# Patient Record
Sex: Male | Born: 1995 | Race: White | Hispanic: No | Marital: Single | Smoking: Current every day smoker
Health system: Southern US, Community
[De-identification: ages and names within clinical notes are randomized; demographics above are authoritative.]

## PROBLEM LIST (undated history)

## (undated) DIAGNOSIS — F909 Attention-deficit hyperactivity disorder, unspecified type: Secondary | ICD-10-CM

## (undated) DIAGNOSIS — S62609A Fracture of unspecified phalanx of unspecified finger, initial encounter for closed fracture: Secondary | ICD-10-CM

## (undated) DIAGNOSIS — Z8709 Personal history of other diseases of the respiratory system: Secondary | ICD-10-CM

## (undated) HISTORY — PX: NO PAST SURGERIES: SHX2092

---
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
## 2015-05-25 ENCOUNTER — Emergency Department (HOSPITAL_COMMUNITY)
Admission: EM | Admit: 2015-05-25 | Discharge: 2015-05-25 | Disposition: A | Discharge status: Home / Self Care | Payer: Medicaid Other | Attending: Emergency Medicine | Admitting: Emergency Medicine

## 2015-05-25 ENCOUNTER — Encounter (HOSPITAL_COMMUNITY): Payer: Self-pay | Admitting: *Deleted

## 2015-05-25 DIAGNOSIS — W260XXA Contact with knife, initial encounter: Secondary | ICD-10-CM | POA: Diagnosis not present

## 2015-05-25 DIAGNOSIS — Y9289 Other specified places as the place of occurrence of the external cause: Secondary | ICD-10-CM | POA: Insufficient documentation

## 2015-05-25 DIAGNOSIS — F909 Attention-deficit hyperactivity disorder, unspecified type: Secondary | ICD-10-CM | POA: Insufficient documentation

## 2015-05-25 DIAGNOSIS — Y9389 Activity, other specified: Secondary | ICD-10-CM | POA: Insufficient documentation

## 2015-05-25 DIAGNOSIS — Z23 Encounter for immunization: Secondary | ICD-10-CM | POA: Insufficient documentation

## 2015-05-25 DIAGNOSIS — Z79899 Other long term (current) drug therapy: Secondary | ICD-10-CM | POA: Diagnosis not present

## 2015-05-25 DIAGNOSIS — Y990 Civilian activity done for income or pay: Secondary | ICD-10-CM | POA: Insufficient documentation

## 2015-05-25 DIAGNOSIS — J45909 Unspecified asthma, uncomplicated: Secondary | ICD-10-CM | POA: Diagnosis not present

## 2015-05-25 DIAGNOSIS — S61212A Laceration without foreign body of right middle finger without damage to nail, initial encounter: Secondary | ICD-10-CM | POA: Diagnosis not present

## 2015-05-25 DIAGNOSIS — Z8781 Personal history of (healed) traumatic fracture: Secondary | ICD-10-CM | POA: Insufficient documentation

## 2015-05-25 DIAGNOSIS — IMO0002 Reserved for concepts with insufficient information to code with codable children: Secondary | ICD-10-CM

## 2015-05-25 DIAGNOSIS — S6991XA Unspecified injury of right wrist, hand and finger(s), initial encounter: Secondary | ICD-10-CM | POA: Diagnosis present

## 2015-05-25 MED ORDER — TETANUS-DIPHTH-ACELL PERTUSSIS 5-2.5-18.5 LF-MCG/0.5 IM SUSP
0.5000 mL | Freq: Once | INTRAMUSCULAR | Status: AC
  Administered 2015-05-25: 0.5 mL via INTRAMUSCULAR
  Filled 2015-05-25: qty 0.5

## 2015-05-25 NOTE — ED Notes (Signed)
Patient verbalized understanding of discharge instructions and denies any further needs or questions at this time. VS stable. Patient ambulatory with steady gait.  

## 2015-05-25 NOTE — ED Notes (Signed)
Pt in c/o laceration to his right middle finger from a knife at work, unsure of last tetanus, bleeding controlled

## 2015-05-25 NOTE — ED Notes (Signed)
Pt refusing xray, PA notified

## 2015-05-25 NOTE — ED Provider Notes (Signed)
CSN: [id number]     Arrival date & time 05/25/15  2143 History  By signing my name below, I, Lyndel Safe, attest that this documentation has been prepared under the direction and in the presence of Glean Hess, [address] Electronically Signed: Lyndel Safe, ED Scribe. 05/25/2015. 10:15 PM.   Chief Complaint  Patient presents with  . Laceration    The history is provided by the patient. No language interpreter was used.    HPI Comments: Charles Henry is a 20 y.o. male who presents to the Emergency Department for chief complaint of right middle finger distal, medial laceration sustained while trying to close his knife while at work PTA. Bleeding is controlled with pressure. He associates constant, moderate throbbing pain to right index finger. He denies exacerbating or alleviating factors. He denies numbness, tingling, or weakness. Tetanus UTD.   Past Medical History  Diagnosis Date  . Fracture of finger of left hand 06/2014    left 3rd finger PI fx.  . ADHD (attention deficit hyperactivity disorder)   . History of asthma     no current med.   Past Surgical History  Procedure Laterality Date  . No past surgeries    . Percutaneous pinning Left 06/09/2014    Procedure: PERCUTANEOUS PINNING EXTREMITY, LEFT 3RD FINGER;  Surgeon: Cheral Almas, MD;  Location: [facility];  Service: Orthopedics;  Laterality: Left;   History reviewed. No pertinent family history. Social History  Substance Use Topics  . Smoking status: Never Smoker   . Smokeless tobacco: Never Used  . Alcohol Use: No     Review of Systems  Musculoskeletal: Positive for arthralgias (right middle finger).  Skin: Positive for wound (distal tip laceratoin to right middle finger).  Neurological: Negative for weakness and numbness.    Allergies  Crab  Home Medications   Prior to Admission medications   Medication Sig Start Date End Date Taking? Authorizing Provider  ALPRAZolam Prudy Feeler) 0.5 MG  tablet Take 0.5 mg by mouth once.    Historical Provider, MD  diphenhydrAMINE (BENADRYL) 50 MG tablet Take 50 mg by mouth at bedtime as needed for itching.    Historical Provider, MD  HYDROcodone-acetaminophen (NORCO) 5-325 MG per tablet Take 1-2 tablets by mouth every 6 (six) hours as needed. Patient not taking: Reported on 07/21/2014 06/09/14   Tarry Kos, MD  lisdexamfetamine (VYVANSE) 40 MG capsule Take 40 mg by mouth every morning.    Historical Provider, MD    BP 133/80 mmHg  Pulse 98  Temp(Src) 98 F (36.7 C) (Oral)  Resp 18  Wt 136 lb 5 oz (61.831 kg)  SpO2 98% Physical Exam  Constitutional: He is oriented to person, place, and time. He appears well-developed and well-nourished. No distress.  HENT:  Head: Normocephalic and atraumatic.  Right Ear: External ear normal.  Left Ear: External ear normal.  Nose: Nose normal.  Eyes: Conjunctivae and EOM are normal. Right eye exhibits no discharge. Left eye exhibits no discharge. No scleral icterus.  Neck: Normal range of motion. Neck supple.  Cardiovascular: Normal rate, regular rhythm and intact distal pulses.   Pulmonary/Chest: Effort normal and breath sounds normal. No respiratory distress.  Musculoskeletal: Normal range of motion. He exhibits no edema or tenderness.  Skin avulsion to distal aspect of right third finger. Full range of motion. Strength and sensation intact.  Neurological: He is alert and oriented to person, place, and time. He has normal strength. No sensory deficit.  Skin: Skin is warm and  dry. He is not diaphoretic.  Psychiatric: He has a normal mood and affect. His behavior is normal.  Nursing note and vitals reviewed.   ED Course  .Nerve Block Date/Time: 05/26/2015 12:02 AM Performed by: Mady Gemma Authorized by: Glean Hess C Consent: Verbal consent obtained. Risks and benefits: risks, benefits and alternatives were discussed Consent given by: patient Patient understanding: patient  states understanding of the procedure being performed Patient consent: the patient's understanding of the procedure matches consent given Procedure consent: procedure consent matches procedure scheduled Relevant documents: relevant documents present and verified Site marked: the operative site was marked Required items: required blood products, implants, devices, and special equipment available Patient identity confirmed: verbally with patient Time out: Immediately prior to procedure a "time out" was called to verify the correct patient, procedure, equipment, support staff and site/side marked as required. Indications comments: Wound cleaning Body area: upper extremity Nerve: digital Laterality: right Patient sedated: no Patient position: sitting Needle gauge: 25 G Location technique: anatomical landmarks Local anesthetic: lidocaine 1% without epinephrine Anesthetic total: 8 ml Outcome: pain improved Patient tolerance: Patient tolerated the procedure well with no immediate complications    DIAGNOSTIC STUDIES: Oxygen Saturation is 98% on RA, normal by my interpretation.    COORDINATION OF CARE: 10:12 PM Will order Xray of right middle finger. Discussed treatment plan with pt at bedside and pt agreed to plan.  10:45 PM Pt is refusing Xray at this time. Will perform digital block using lidocaine 1% w/o ephinephrine and wound care. Pt is agreeable to plan.    MDM   Final diagnoses:  Laceration    20 year old male presents with laceration to his right 3rd finger, which he sustained while using a knife at work prior to arrival. Denies numbness, weakness, paresthesia.  Patient is afebrile. Vital signs stable. On exam, patient has skin avulsion to the distal aspect of his right third finger. Full range of motion. Strength and sensation intact.   Patient declined imaging of right hand.  Tetanus updated. Digital block performed. Wound cleaned and dressed, which the patient tolerated  well.  Patient is non-toxic and well-appearing, feel he is stable for discharge at this time. Patient to follow-up with PCP in 2-3 days for wound re-check. Return precautions discussed. Patient verbalizes his understanding and is in agreement with plan.  BP 134/74 mmHg  Pulse 84  Temp(Src) 98.4 F (36.9 C) (Oral)  Resp 16  Wt 61.831 kg  SpO2 99%    Mady Gemma, PA-C 05/26/15 0004  Lavera Guise, MD 05/26/15 [phone]

## 2015-05-25 NOTE — Discharge Instructions (Signed)
1. Medications: usual home medications 2. Treatment: rest, drink plenty of fluids, wear dressing and keep clean and dry 3. Follow Up: please followup with your primary doctor in 2-3 days for wound recheck and for discussion of your diagnoses and further evaluation after today's visit; if you do not have a primary care doctor use the resource guide provided to find one; please return to the ER for increased pain or swelling, redness, new or worsening symptoms   Emergency Department Resource Guide 1) Find a Doctor and Pay Out of Pocket Although you won't have to find out who is covered by your insurance plan, it is a good idea to ask around and get recommendations. You will then need to call the office and see if the doctor you have chosen will accept you as a new patient and what types of options they offer for patients who are self-pay. Some doctors offer discounts or will set up payment plans for their patients who do not have insurance, but you will need to ask so you aren't surprised when you get to your appointment.  2) Contact Your Local Health Department Not all health departments have doctors that can see patients for sick visits, but many do, so it is worth a call to see if yours does. If you don't know where your local health department is, you can check in your phone book. The CDC also has a tool to help you locate your state's health department, and many state websites also have listings of all of their local health departments.  3) Find a Walk-in Clinic If your illness is not likely to be very severe or complicated, you may want to try a walk in clinic. These are popping up all over the country in pharmacies, drugstores, and shopping centers. They're usually staffed by nurse practitioners or physician assistants that have been trained to treat common illnesses and complaints. They're usually fairly quick and inexpensive. However, if you have serious medical issues or chronic medical  problems, these are probably not your best option.  No Primary Care Doctor: - Call Health Connect at  [phone] - they can help you locate a primary care doctor that  accepts your insurance, provides certain services, etc. - Physician Referral Service- [phone]  Chronic Pain Problems: Organization         Address  Phone   Notes  [facility]  [phone] Patients need to be referred by their primary care doctor.   Medication Assistance: Organization         Address  Phone   Notes  [facility] Medication [facility] [address] [place] [place], [place] [address] [phone] --Must be a resident of [facility] -- Must have NO insurance coverage whatsoever (no Medicaid/ Medicare, etc.) -- The pt. MUST have a primary care doctor that directs their care regularly and follows them in the community   MedAssist  [phone]   [employer]  [phone]    Agencies that provide inexpensive medical care: Organization         Address  Phone   Notes  [facility]  [phone]   [facility]    [phone]   [facility] [address] [place], [place] [address] [phone]   [facility] [address] [place], [place] [phone]   Planned Parenthood    [phone]   [facility]    [phone]   [place] [address], [place] Phone:  [phone], Fax:  [phone] Hours of Operation:  9 am - 6 pm, M-F.  Also accepts Medicaid/Medicare and self-pay.  [facility] for [place], [address], [place] Phone: [phone], Fax: [phone]. Hours of Operation:  8:30 am - 5:30 pm, M-F.  Also accepts Medicaid and self-pay.  [facility] [address] [place] Phone: [phone]   [place], [place], [place] [phone], Ext. [phone]  Mondays & Thursdays: 7-9 AM.  First 15 patients are seen on a first come, first serve basis.    [place] Providers:  Organization         Address  Phone   Notes  [facility] [address], [place] [phone] Also accepts self-pay patients.  [facility] [address], [place]on Torres  [phone]   [place], [address], [place] [phone]   [facility] [address] [place] [phone]   Lucianne Lei [address], [place]   [phone] Only accepts [place] Access [place] patients after they have their name applied to their card.   Self-Pay (no insurance) in [facility]:  Organization         Address  Phone   Notes  Sickle Cell Patients, [facility] [place] [phone]   [facility] Care [place] [phone]   [facility] [place]  [place], [place], [place] [phone]   [facility]/Dr. Osei-Bonsu  [address] [place] or [address], [place] [phone] Phone number for both [place] and [place] locations is the same.  Urgent Medical and [facility] [address] [place] [phone]   [facility] [address] [place] or [address] [phone]   [facility] [address] [place] [phone], phone; [phone], fax Sees patients 1st and 3rd Saturday of every month.  Must not qualify for public or private insurance (i.e. Medicaid, Medicare, [place] Health Choice, Veterans' Benefits)  Household income should be no more than 200% of the poverty level The clinic cannot treat you if you are pregnant or think you are pregnant  Sexually transmitted diseases are not treated at  the clinic.    Dental Care: Organization         Address  Phone  Notes  [facility] Department of [facility] [phone] Accepts children up to age 55 who are enrolled in [place] or [place]; pregnant women with a Medicaid card; and children who have applied for Medicaid or [place] Health Choice, but were declined, whose parents can pay a reduced fee at time of service.  [facility] Department of [facility]  [address], [place] [phone] Accepts children up to age 74 who are enrolled in [place] or [place]; pregnant women with a Medicaid card; and children who have applied for Medicaid or [place] Health Choice, but were declined, whose parents can pay a reduced fee at time of service.  [place]  Access PROGRAM  [place] [phone] Patients are seen by appointment only. Walk-ins are not accepted. [place] will see patients 18 years of age and older. Monday - Tuesday (8am-5pm) Most Wednesdays (8:30-5pm) $30 per visit, cash only  [facility] Adult Dental Access PROGRAM  [address], [facility] [phone] Patients are seen by appointment only. Walk-ins are not accepted. [place] will see patients 66 years of age and older. One Wednesday Evening (Monthly: Volunteer Based).  $30 per visit, cash only  [place]  [phone] for adults; Children under age 79, call Graduate Pediatric Dentistry at [phone]. Children aged 46-14, please call [phone] to request a pediatric application.  Dental services are provided in all areas of dental care including fillings, crowns and bridges, complete and partial dentures, implants, gum treatment, root canals, and extractions. Preventive care is also provided. Treatment is provided to both adults and children. Patients are selected via a lottery and there is often a  waiting list.   [facility] [address] [place]  [phone] [email or web address]   [facility] [address] [place], [place] [phone], Ext. [phone] Second and Fourth Thursday of each month, opens at 6:30 AM; Clinic ends at 9 AM.  Patients are seen on a first-come first-served basis, and a limited number are seen during each clinic.   [facility]  [address] Hillard Danker [place], [place] [phone]   Eligibility Requirements You must have lived in [place], [place], or [place] counties for at least the last three months.   You cannot be eligible for state or federal sponsored [employer], including [employer], [place], or [employer].   You generally cannot be eligible for healthcare insurance through your employer.    How to apply: Eligibility screenings are held every Tuesday and Wednesday afternoon from 1:00 pm until 4:00 pm. You do not need an appointment for the interview!  [facility] [address] [place], [place]   [place]  [place] Department  [place]  [phone]    Behavioral Health Resources in the Community: Intensive Outpatient Programs Organization         Address  Phone  Notes  [place]. [address], [place], [place] [phone]   [facility] [address] [place], [place]   ADS: Alcohol & Drug Svcs [address], [place], [place]   [place] [address]  [place], [place] or [phone]   Substance Abuse Resources Organization         Address  Phone  Notes  Alcohol and Drug Services  [phone]   [place]  [phone]   [facility] [place]   Chinita Pester  [phone]   Residential & Outpatient Substance Abuse  Program  [phone]   Psychological Services Organization         Address  Phone  Notes  [facility] [place]  [place]  [phone]   [place] [address] [place] or [phone]    Mobile Crisis Teams Organization         Address  Phone  Notes  [facility], Mobile Crisis Care Unit  [phone]   [facility]  [address] [place],  [place] [address] [place] [phone]    Self-Help/Support Groups Organization         Address  Phone             Notes  Mental Health Assoc. of [place] - variety of support groups  [place] Call for more information  Narcotics Anonymous (NA), [facility] [address], [employer] [place]  2 meetings at this location   Special educational needs teacher         Address  Phone  Notes  [facility]AP Residential Treatment [place],    [place]  [phone]   [facility]  [address] [place] 937902, [place], [place]   [place], [place] [phone] Admissions: 8am-3pm M-F  Incentives Substance [place] [address]    [place], [place] [phone]   [facility] [address] [place] [place], [place], [place]   [facility] [address]  [place], [place]   [facility] Programs - Intensive Outpatient [address], [place], [place]   [facility] ([place] [place].,  [place], [place] [phone] or [phone]   [facility] Services (RTS) [address] [place], [place] Accepts Medicaid  [facility] [place] [address],  [place] [phone] Substance Abuse/Addiction Treatment   [facility] Organization          Address  Phone  Notes  [facility]  [phone]   Domenic Schwab, PhD [address] Arlis Porta [place], [place]   [phone] or [phone]   [place] Celina Evan [place], [place] [phone]   [facility] [address], [place], [place] [phone] Insurance/Medicaid/sponsorship through [facility] and Families [address], [address] [place]                                    [place], [place] [phone] [place] [address] [place], [place] [phone]    Dr. Adele Schilder  [phone]   [facility] Dept. 1) [address], [place] 2) [place] 3)  [address], [place] [phone]  [phone]   [place] [phone] or [phone] (After Hours)

## 2016-08-24 ENCOUNTER — Emergency Department (HOSPITAL_COMMUNITY)
Admission: EM | Admit: 2016-08-24 | Discharge: 2016-08-24 | Disposition: A | Discharge status: Home / Self Care | Payer: Medicaid Other | Attending: Dermatology | Admitting: Dermatology

## 2016-08-24 ENCOUNTER — Ambulatory Visit (HOSPITAL_COMMUNITY)
Admission: EM | Admit: 2016-08-24 | Discharge: 2016-08-24 | Disposition: A | Discharge status: Home / Self Care | Payer: Medicaid Other | Attending: Family Medicine | Admitting: Family Medicine

## 2016-08-24 ENCOUNTER — Encounter (HOSPITAL_COMMUNITY): Payer: Self-pay | Admitting: Emergency Medicine

## 2016-08-24 DIAGNOSIS — L298 Other pruritus: Secondary | ICD-10-CM | POA: Insufficient documentation

## 2016-08-24 DIAGNOSIS — T783XXA Angioneurotic edema, initial encounter: Secondary | ICD-10-CM

## 2016-08-24 DIAGNOSIS — T7840XA Allergy, unspecified, initial encounter: Secondary | ICD-10-CM

## 2016-08-24 DIAGNOSIS — Z5321 Procedure and treatment not carried out due to patient leaving prior to being seen by health care provider: Secondary | ICD-10-CM | POA: Insufficient documentation

## 2016-08-24 DIAGNOSIS — T781XXA Other adverse food reactions, not elsewhere classified, initial encounter: Secondary | ICD-10-CM | POA: Diagnosis not present

## 2016-08-24 MED ORDER — HYDROXYZINE HCL 25 MG PO TABS: 25.0000 mg | ORAL_TABLET | Freq: Four times a day (QID) | ORAL | 0 refills | Status: DC

## 2016-08-24 MED ORDER — METHYLPREDNISOLONE 4 MG PO TBPK: ORAL_TABLET | ORAL | 0 refills | Status: DC

## 2016-08-24 NOTE — ED Notes (Signed)
Pt called x 2 for triage. Not seen in lobby

## 2016-08-24 NOTE — ED Notes (Signed)
Pt came in contact with shellfish, hx of shellfish allergy. No urticaria, angioedema, or swelling. Pt c/o itching on his ears. He stated he took benadryl today at 1600 with some improvement to the itching.

## 2016-08-24 NOTE — ED Provider Notes (Signed)
CSN: [id number]     Arrival date & time 08/24/16  1210 History   None    Chief Complaint  Patient presents with  . Allergies   (Consider location/radiation/quality/duration/timing/severity/associated sxs/prior Treatment) Patient states he had some swelling on face and lips last night.  He has shellfish allergy and came into contact with shellfish.  He took some benadryl last night and it helped.   The history is provided by the patient.  Allergic Reaction  Presenting symptoms: rash and swelling   Severity:  Moderate Duration:  1 day Relieved by:  Nothing Worsened by:  Nothing Ineffective treatments:  None tried   Past Medical History:  Diagnosis Date  . ADHD (attention deficit hyperactivity disorder)   . Fracture of finger of left hand 06/2014   left 3rd finger PI fx.  . History of asthma    no current med.   Past Surgical History:  Procedure Laterality Date  . NO PAST SURGERIES    . PERCUTANEOUS PINNING Left 06/09/2014   Procedure: PERCUTANEOUS PINNING EXTREMITY, LEFT 3RD FINGER;  Surgeon: Cheral Almas, MD;  Location: [facility];  Service: Orthopedics;  Laterality: Left;   History reviewed. No pertinent family history. Social History  Substance Use Topics  . Smoking status: Never Smoker  . Smokeless tobacco: Never Used  . Alcohol use No    Review of Systems  Constitutional: Negative.   HENT: Negative.   Eyes: Negative.   Respiratory: Negative.   Cardiovascular: Negative.   Gastrointestinal: Negative.   Endocrine: Negative.   Genitourinary: Negative.   Musculoskeletal: Negative.   Skin: Positive for rash.  Allergic/Immunologic: Negative.   Neurological: Negative.     Allergies  Crab [shellfish allergy]  Home Medications   Prior to Admission medications   Medication Sig Start Date End Date Taking? Authorizing Provider  hydrOXYzine (ATARAX/VISTARIL) 25 MG tablet Take 1 tablet (25 mg total) by mouth every 6 (six) hours. 08/24/16   Deatra Canter, FNP  methylPREDNISolone (MEDROL DOSEPAK) 4 MG TBPK tablet Take 6-5-4-3-2-1 po qd 08/24/16   Deatra Canter, FNP   Meds Ordered and Administered this Visit  Medications - No data to display  BP (!) 147/93 (BP Location: Right Arm)   Pulse 77   Temp 98.4 F (36.9 C) (Oral)   Resp 18   SpO2 100%  No data found.   Physical Exam  Constitutional: He is oriented to person, place, and time. He appears well-developed and well-nourished.  HENT:  Head: Normocephalic and atraumatic.  Right Ear: External ear normal.  Left Ear: External ear normal.  Mouth/Throat: Oropharynx is clear and moist.  Eyes: Conjunctivae and EOM are normal. Pupils are equal, round, and reactive to light.  Neck: Normal range of motion. Neck supple.  Cardiovascular: Normal rate, regular rhythm and normal heart sounds.   Pulmonary/Chest: Effort normal and breath sounds normal.  Neurological: He is alert and oriented to person, place, and time.  Skin: Skin is warm.  Nursing note and vitals reviewed.   Urgent Care Course     Procedures (including critical care time)  Labs Review Labs Reviewed - No data to display  Imaging Review No results found.   Visual Acuity Review  Right Eye Distance:   Left Eye Distance:   Bilateral Distance:    Right Eye Near:   Left Eye Near:    Bilateral Near:         MDM   1. Allergic reaction, initial encounter   2. Angioedema,  initial encounter    Medrol dose pack as directed  #21 Hydroxyzine  one q 4 hours prn #12      Deatra Canter, FNP 08/24/16 1321

## 2016-08-24 NOTE — ED Notes (Signed)
Pt called for triage x1 with no answer

## 2016-08-24 NOTE — ED Triage Notes (Signed)
The patient presented to the [facility] with a possible reaction to shellfish at work 2 nights ago. The patient stated that he has had angioedema since.

## 2016-12-17 ENCOUNTER — Encounter (HOSPITAL_COMMUNITY): Payer: Self-pay | Admitting: Emergency Medicine

## 2016-12-17 ENCOUNTER — Ambulatory Visit (HOSPITAL_COMMUNITY)
Admission: EM | Admit: 2016-12-17 | Discharge: 2016-12-17 | Disposition: A | Discharge status: Home / Self Care | Payer: Medicaid Other | Attending: Family Medicine | Admitting: Family Medicine

## 2016-12-17 DIAGNOSIS — S39012A Strain of muscle, fascia and tendon of lower back, initial encounter: Secondary | ICD-10-CM

## 2016-12-17 NOTE — Discharge Instructions (Signed)
You can use ibuprofen and  tylenol for pain relief. 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 Follow up as needed

## 2016-12-17 NOTE — ED Provider Notes (Signed)
[facility]    CSN: [id number] Arrival date & time: 12/17/16  1106     History   Chief Complaint Chief Complaint  Patient presents with  . Back Pain    HPI Charles Henry is a 21 y.o. male.   HPI  Patient with 1 week of lumbar strain on right side. States that he has pain with range of motion. He was a Nature conservation officerstocker at a facility, puts Gatorade and such on shelves. He has not used any medications. No red flags on history.    Past Medical History:  Diagnosis Date  . ADHD (attention deficit hyperactivity disorder)   . Fracture of finger of left hand 06/2014   left 3rd finger PI fx.  . History of asthma    no current med.    There are no active problems to display for this patient.   Past Surgical History:  Procedure Laterality Date  . NO PAST SURGERIES    . PERCUTANEOUS PINNING Left 06/09/2014   Procedure: PERCUTANEOUS PINNING EXTREMITY, LEFT 3RD FINGER;  Surgeon: Cheral AlmasNaiping Michael Xu, MD;  Location:  SURGERY CENTER;  Service: Orthopedics;  Laterality: Left;       Home Medications    Prior to Admission medications   Medication Sig Start Date End Date Taking? Authorizing Provider  hydrOXYzine (ATARAX/VISTARIL) 25 MG tablet Take 1 tablet (25 mg total) by mouth every 6 (six) hours. 08/24/16   Deatra Canterxford, William J, FNP  methylPREDNISolone (MEDROL DOSEPAK) 4 MG TBPK tablet Take 6-5-4-3-2-1 po qd 08/24/16   Deatra Canterxford, William J, FNP    Family History No family history on file.  Social History Social History  Substance Use Topics  . Smoking status: Current Every Day Smoker    Types: Cigarettes  . Smokeless tobacco: Never Used  . Alcohol use No     Allergies   Crab [shellfish allergy] and Oak bark [quercus robur]   Review of Systems Review of Systems  Constitutional: Negative for chills, fever and unexpected weight change.  HENT: Negative for congestion.   Eyes: Negative for discharge.  Respiratory: Negative for cough.   Cardiovascular: Negative for  chest pain and leg swelling.  Gastrointestinal: Negative for abdominal pain.     Physical Exam Triage Vital Signs ED Triage Vitals  Enc Vitals Group     BP 12/17/16 1130 137/82     Pulse Rate 12/17/16 1130 60     Resp 12/17/16 1130 16     Temp 12/17/16 1130 98.9 F (37.2 C)     Temp Source 12/17/16 1130 Oral     SpO2 12/17/16 1130 99 %     Weight 12/17/16 1130 135 lb (61.2 kg)     Height 12/17/16 1130 5\' 11"  (1.803 m)     Head Circumference --      Peak Flow --      Pain Score 12/17/16 1131 6     Pain Loc --      Pain Edu? --      Excl. in GC? --    No data found.   Updated Vital Signs BP 137/82   Pulse 60   Temp 98.9 F (37.2 C) (Oral)   Resp 16   Ht 5\' 11"  (1.803 m)   Wt 135 lb (61.2 kg)   SpO2 99%   BMI 18.83 kg/m   Physical Exam  Constitutional: He is oriented to person, place, and time. He appears well-developed and well-nourished.  HENT:  Head: Normocephalic and atraumatic.  Eyes: Pupils are  equal, round, and reactive to light. EOM are normal.  Neck: Normal range of motion. Neck supple.  Cardiovascular: Normal rate, regular rhythm, normal heart sounds and intact distal pulses.   Pulmonary/Chest: Effort normal and breath sounds normal.  Abdominal: Soft. Bowel sounds are normal.  Musculoskeletal: Normal range of motion.  Pain with extension and flexion of back. Pain with palpation of lower lumbar paraspinal muscles., 5 out of 5 strength, normal reflexes, normal sensation  Neurological: He is alert and oriented to person, place, and time.  Skin: Skin is warm and dry.     UC Treatments / Results  Labs (all labs ordered are listed, but only abnormal results are displayed) Labs Reviewed - No data to display  EKG  EKG Interpretation None       Radiology No results found.  Procedures Procedures (including critical care time)  Medications Ordered in UC Medications - No data to display   Initial Impression / Assessment and Plan / UC Course  I  have reviewed the triage vital signs and the nursing notes.  Pertinent labs & imaging results that were available during my care of the patient were reviewed by me and considered in my medical decision making (see chart for details).    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 No red flags. Advised patient to stretch, and use ibuprofen and Tylenol as needed for pain.  Final Clinical Impressions(s) / UC Diagnoses   Final diagnoses:  Strain of lumbar region, initial encounter    New Prescriptions Discharge Medication List as of 12/17/2016 11:43 AM       Berton Bon, MD 12/17/16 1155

## 2016-12-17 NOTE — ED Triage Notes (Signed)
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 PT complains of right sided lower back pain

## 2019-03-17 ENCOUNTER — Encounter (HOSPITAL_COMMUNITY): Payer: Self-pay

## 2019-03-17 ENCOUNTER — Emergency Department (HOSPITAL_COMMUNITY): Payer: Medicaid Other

## 2019-03-17 ENCOUNTER — Emergency Department (HOSPITAL_COMMUNITY)
Admission: EM | Admit: 2019-03-17 | Discharge: 2019-03-18 | Disposition: A | Discharge status: Other Acute Inpatient Hospital | Payer: Medicaid Other | Attending: Emergency Medicine | Admitting: Emergency Medicine

## 2019-03-17 ENCOUNTER — Other Ambulatory Visit: Payer: Self-pay

## 2019-03-17 DIAGNOSIS — Y92038 Other place in apartment as the place of occurrence of the external cause: Secondary | ICD-10-CM | POA: Diagnosis not present

## 2019-03-17 DIAGNOSIS — S41011A Laceration without foreign body of right shoulder, initial encounter: Secondary | ICD-10-CM | POA: Diagnosis not present

## 2019-03-17 DIAGNOSIS — F322 Major depressive disorder, single episode, severe without psychotic features: Secondary | ICD-10-CM | POA: Insufficient documentation

## 2019-03-17 DIAGNOSIS — Z046 Encounter for general psychiatric examination, requested by authority: Secondary | ICD-10-CM | POA: Diagnosis present

## 2019-03-17 DIAGNOSIS — F17210 Nicotine dependence, cigarettes, uncomplicated: Secondary | ICD-10-CM | POA: Diagnosis not present

## 2019-03-17 DIAGNOSIS — Y999 Unspecified external cause status: Secondary | ICD-10-CM | POA: Insufficient documentation

## 2019-03-17 DIAGNOSIS — J45909 Unspecified asthma, uncomplicated: Secondary | ICD-10-CM | POA: Diagnosis not present

## 2019-03-17 DIAGNOSIS — Z20828 Contact with and (suspected) exposure to other viral communicable diseases: Secondary | ICD-10-CM | POA: Insufficient documentation

## 2019-03-17 DIAGNOSIS — Y9389 Activity, other specified: Secondary | ICD-10-CM | POA: Insufficient documentation

## 2019-03-17 DIAGNOSIS — F10929 Alcohol use, unspecified with intoxication, unspecified: Secondary | ICD-10-CM | POA: Diagnosis not present

## 2019-03-17 DIAGNOSIS — F1210 Cannabis abuse, uncomplicated: Secondary | ICD-10-CM | POA: Diagnosis not present

## 2019-03-17 DIAGNOSIS — Z7289 Other problems related to lifestyle: Secondary | ICD-10-CM

## 2019-03-17 DIAGNOSIS — Z23 Encounter for immunization: Secondary | ICD-10-CM | POA: Insufficient documentation

## 2019-03-17 DIAGNOSIS — Y908 Blood alcohol level of 240 mg/100 ml or more: Secondary | ICD-10-CM | POA: Diagnosis not present

## 2019-03-17 DIAGNOSIS — X781XXA Intentional self-harm by knife, initial encounter: Secondary | ICD-10-CM | POA: Diagnosis not present

## 2019-03-17 DIAGNOSIS — R45851 Suicidal ideations: Secondary | ICD-10-CM
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
## 2019-03-17 NOTE — ED Notes (Signed)
Patient was brought in by [employer]. [employer] Officer approached this NT stated that pt had cut himself and was bleeding. This NT went to go check on pt and noticed that pt had a laceration that was currently bleeding. This NT clean up the area and Dr. Billy Fischer was present assessed the pt. This NT finished cleaning the affected area and wrapped it up. Pt is currently sitting in Peds Triage 4 with [employer].

## 2019-03-17 NOTE — ED Provider Notes (Signed)
MSE was initiated and I personally evaluated the patient and placed orders (if any) at  11:40 PM on March 17, 2019   Present in triage as patient brought in.  Has laceration to right supraclavicular area/shoulder.  It appears superficial. XR ordered does not show intrathoracic injury, good pulses bilaterally, doubt significant vascular or thoracic injury.  Dressing placed over wound with pressure, stable for follow up with next provider for wound and psychiatric care.     Gareth Morgan, MD 03/17/19 [phone]

## 2019-03-18 ENCOUNTER — Encounter (HOSPITAL_COMMUNITY): Payer: Self-pay | Admitting: *Deleted

## 2019-03-18 ENCOUNTER — Inpatient Hospital Stay (HOSPITAL_COMMUNITY)
Admission: AD | Admit: 2019-03-18 | Discharge: 2019-03-20 | DRG: 885 | Disposition: A | Discharge status: Home / Self Care | Payer: Medicaid Other | Source: Intra-hospital | Attending: Psychiatry | Admitting: Psychiatry

## 2019-03-18 DIAGNOSIS — Z20828 Contact with and (suspected) exposure to other viral communicable diseases: Secondary | ICD-10-CM | POA: Diagnosis present

## 2019-03-18 DIAGNOSIS — IMO0002 Reserved for concepts with insufficient information to code with codable children: Secondary | ICD-10-CM

## 2019-03-18 DIAGNOSIS — X789XXA Intentional self-harm by unspecified sharp object, initial encounter: Secondary | ICD-10-CM | POA: Diagnosis present

## 2019-03-18 DIAGNOSIS — G4700 Insomnia, unspecified: Secondary | ICD-10-CM | POA: Diagnosis present

## 2019-03-18 DIAGNOSIS — F1094 Alcohol use, unspecified with alcohol-induced mood disorder: Secondary | ICD-10-CM | POA: Diagnosis not present

## 2019-03-18 DIAGNOSIS — Z7289 Other problems related to lifestyle: Secondary | ICD-10-CM | POA: Diagnosis not present

## 2019-03-18 DIAGNOSIS — F322 Major depressive disorder, single episode, severe without psychotic features: Principal | ICD-10-CM | POA: Diagnosis present

## 2019-03-18 DIAGNOSIS — Y908 Blood alcohol level of 240 mg/100 ml or more: Secondary | ICD-10-CM | POA: Diagnosis present

## 2019-03-18 DIAGNOSIS — F1014 Alcohol abuse with alcohol-induced mood disorder: Secondary | ICD-10-CM | POA: Diagnosis present

## 2019-03-18 DIAGNOSIS — F17210 Nicotine dependence, cigarettes, uncomplicated: Secondary | ICD-10-CM | POA: Diagnosis present

## 2019-03-18 LAB — CBC
HCT: 46.3 % (ref 39.0–52.0)
Hemoglobin: 15.5 g/dL (ref 13.0–17.0)
MCH: 29.6 pg (ref 26.0–34.0)
MCHC: 33.5 g/dL (ref 30.0–36.0)
MCV: 88.5 fL (ref 80.0–100.0)
Platelets: 296 10*3/uL (ref 150–400)
RBC: 5.23 MIL/uL (ref 4.22–5.81)
RDW: 13.8 % (ref 11.5–15.5)
WBC: 10.7 10*3/uL — ABNORMAL HIGH (ref 4.0–10.5)
nRBC: 0 % (ref 0.0–0.2)

## 2019-03-18 LAB — COMPREHENSIVE METABOLIC PANEL
ALT: 19 U/L (ref 0–44)
AST: 22 U/L (ref 15–41)
Albumin: 4.9 g/dL (ref 3.5–5.0)
Alkaline Phosphatase: 67 U/L (ref 38–126)
Anion gap: 18 — ABNORMAL HIGH (ref 5–15)
BUN: 10 mg/dL (ref 6–20)
CO2: 22 mmol/L (ref 22–32)
Calcium: 9.3 mg/dL (ref 8.9–10.3)
Chloride: 101 mmol/L (ref 98–111)
Creatinine, Ser: 0.96 mg/dL (ref 0.61–1.24)
GFR calc Af Amer: >60 mL/min (ref >60)
GFR calc non Af Amer: >60 mL/min (ref >60)
Glucose, Bld: 81 mg/dL (ref 70–99)
Potassium: 3.5 mmol/L (ref 3.5–5.1)
Sodium: 141 mmol/L (ref 135–145)
Total Bilirubin: 0.7 mg/dL (ref 0.3–1.2)
Total Protein: 8 g/dL (ref 6.5–8.1)

## 2019-03-18 LAB — RAPID URINE DRUG SCREEN, HOSP PERFORMED
Amphetamines: NOT DETECTED
Barbiturates: NOT DETECTED
Benzodiazepines: NOT DETECTED
Cocaine: NOT DETECTED
Opiates: NOT DETECTED
Tetrahydrocannabinol: POSITIVE — AB

## 2019-03-18 LAB — SALICYLATE LEVEL: Salicylate Lvl: <7 mg/dL (ref 2.8–30.0)

## 2019-03-18 LAB — ACETAMINOPHEN LEVEL: Acetaminophen (Tylenol), Serum: <10 ug/mL — ABNORMAL LOW (ref 10–30)

## 2019-03-18 LAB — ETHANOL: Alcohol, Ethyl (B): 279 mg/dL — ABNORMAL HIGH (ref <10)

## 2019-03-18 LAB — SARS CORONAVIRUS 2 BY RT PCR (HOSPITAL ORDER, PERFORMED IN ~~LOC~~ HOSPITAL LAB): SARS Coronavirus 2: NEGATIVE

## 2019-03-18 MED ORDER — MAGNESIUM HYDROXIDE 400 MG/5ML PO SUSP: 30.0000 mL | Freq: Every day | ORAL | Status: DC | PRN

## 2019-03-18 MED ORDER — TETANUS-DIPHTH-ACELL PERTUSSIS 5-2.5-18.5 LF-MCG/0.5 IM SUSP
0.5000 mL | Freq: Once | INTRAMUSCULAR | Status: AC
  Administered 2019-03-18: 0.5 mL via INTRAMUSCULAR
  Filled 2019-03-18: qty 0.5

## 2019-03-18 MED ORDER — ALUM & MAG HYDROXIDE-SIMETH 200-200-20 MG/5ML PO SUSP: 30.0000 mL | ORAL | Status: DC | PRN

## 2019-03-18 MED ORDER — LIDOCAINE HCL 2 % IJ SOLN
20.0000 mL | Freq: Once | INTRAMUSCULAR | Status: AC
  Administered 2019-03-18: 400 mg
  Filled 2019-03-18: qty 20

## 2019-03-18 MED ORDER — TRAZODONE HCL 50 MG PO TABS
50.0000 mg | ORAL_TABLET | Freq: Every evening | ORAL | Status: DC | PRN
  Administered 2019-03-18: 50 mg via ORAL
  Filled 2019-03-18 (×2): qty 1

## 2019-03-18 MED ORDER — ACETAMINOPHEN 325 MG PO TABS: 650.0000 mg | ORAL_TABLET | Freq: Four times a day (QID) | ORAL | Status: DC | PRN

## 2019-03-18 MED ORDER — ONDANSETRON 4 MG PO TBDP
4.0000 mg | ORAL_TABLET | Freq: Three times a day (TID) | ORAL | Status: DC | PRN
  Administered 2019-03-18: 4 mg via ORAL
  Filled 2019-03-18: qty 1

## 2019-03-18 MED ORDER — HYDROXYZINE HCL 25 MG PO TABS
25.0000 mg | ORAL_TABLET | Freq: Three times a day (TID) | ORAL | Status: DC | PRN
  Administered 2019-03-18: 25 mg via ORAL
  Filled 2019-03-18 (×2): qty 1

## 2019-03-18 NOTE — Progress Notes (Signed)
Pt admitted to [facility] involuntary after making a cut to his rt upper shoulder that required sutures. Pt minimizes his si attempt and blames it on drinking. Pt lost his friend in May and says that there has been no closure since he does not know why she passed away. Pt and his friend were both brought up in foster care. Per report pt was posting pictures of him bleeding and has previous suicide attempts. Pt goes to [facility] and works at [employer]. He lives alone and has access to a gun.

## 2019-03-18 NOTE — ED Notes (Signed)
Pt informed of transfer to [facility]. All pt belongings and IVC papers given to [employer]. Pt left with [employer] to go to [facility].
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
## 2019-03-18 NOTE — BH Assessment (Signed)
[place] Assessment Progress Note   Patient has been accepted to [facility].  Clinician informed Dr. Leonides Schanz that patient was accepted and the rapid COVID would be needed.  Dr. Leonides Schanz will contact [facility] at [facility].  Results of COVID rapid test will be needed prior to patient transport.  Voluntary admission papers should be faxed to [phone] prior to transport.  Accepting physician is Dr. Mallie Darting.

## 2019-03-18 NOTE — Tx Team (Signed)
Initial Treatment Plan 03/18/2019 4:45 PM Elmore Hyslop JOA:[id number]    PATIENT STRESSORS: Loss of girlfriend passed away   PATIENT STRENGTHS: Average or above average intelligence Capable of independent living General fund of knowledge Physical Health Work skills   PATIENT IDENTIFIED PROBLEMS: Grief and loss  Self harm behaviors                   DISCHARGE CRITERIA:  Ability to meet basic life and health needs Adequate post-discharge living arrangements Improved stabilization in mood, thinking, and/or behavior Medical problems require only outpatient monitoring Motivation to continue treatment in a less acute level of care Need for constant or close observation no longer present Reduction of life-threatening or endangering symptoms to within safe limits Safe-care adequate arrangements made Verbal commitment to aftercare and medication compliance  PRELIMINARY DISCHARGE PLAN: Outpatient therapy Return to previous living arrangement Return to previous work or school arrangements  PATIENT/FAMILY INVOLVEMENT: This treatment plan has been presented to and reviewed with the patient, Charles Henry, and/or family member, .  The patient and family have been given the opportunity to ask questions and make suggestions.  Mosie Lukes, RN 03/18/2019, 4:45 PM

## 2019-03-18 NOTE — BH Assessment (Signed)
[facility] Assessment Progress Note   Clinician called several times to the telepsych machine.  No answer.

## 2019-03-18 NOTE — ED Provider Notes (Signed)
..  Laceration Repair  Date/Time: 03/18/2019 1:55 AM Performed by: Tacy Learn, PA-C Authorized by: Tacy Learn, PA-C   Consent:    Consent obtained:  Verbal   Consent given by:  Patient   Risks discussed:  Infection, need for additional repair, pain, poor cosmetic result and poor wound healing   Alternatives discussed:  No treatment and delayed treatment Universal protocol:    Procedure explained and questions answered to patient or proxy's satisfaction: yes     Relevant documents present and verified: yes     Test results available and properly labeled: yes     Imaging studies available: yes     Required blood products, implants, devices, and special equipment available: yes     Site/side marked: yes     Immediately prior to procedure, a time out was called: yes     Patient identity confirmed:  Verbally with patient Anesthesia (see MAR for exact dosages):    Anesthesia method:  Local infiltration   Local anesthetic:  Lidocaine 2% w/o epi Laceration details:    Location:  Trunk   Trunk location:  R chest   Length (cm):  8   Depth (mm):  5 Repair type:    Repair type:  Simple Pre-procedure details:    Preparation:  Patient was prepped and draped in usual sterile fashion and imaging obtained to evaluate for foreign bodies Exploration:    Hemostasis achieved with:  Direct pressure   Wound exploration: wound explored through full range of motion and entire depth of wound probed and visualized     Wound extent: no muscle damage noted and no vascular damage noted     Contaminated: no   Treatment:    Area cleansed with:  Saline   Amount of cleaning:  Extensive   Irrigation solution:  Sterile saline Skin repair:    Repair method:  Sutures   Suture size:  4-0   Suture material:  Nylon   Suture technique:  Simple interrupted   Number of sutures:  7 Approximation:    Approximation:  Close Post-procedure details:    Dressing:  Non-adherent dressing and antibiotic  ointment      Tacy Learn, PA-C 03/18/19 0157    Ward, Delice Bison, DO 03/18/19 [phone]

## 2019-03-18 NOTE — ED Notes (Signed)
Belongings were inventoried and placed on floor in purple zone due to lockers being unavailable. Belongings sheet is at green zone nursing station.

## 2019-03-18 NOTE — ED Provider Notes (Signed)
TIME SEEN: 1:13 AM  CHIEF COMPLAINT: Suicidal ideation  HPI: Patient is a 23 year old male with history of ADHD who presents to the emergency department in police custody for concerns for suicidal thoughts.  Patient has multiple superficial lacerations to the right shoulder and then a deeper laceration that needs repair.  Unsure of last tetanus vaccination.  Has been drinking alcohol tonight.  Denies drug use.  He denies that this was a suicide attempt.  Repeatedly states that he fell.  Police were called out to the house twice today as friends called police because they were concerned that patient was posting on social media that he was going to kill himself and posted pictures of his laceration.  Police state that he reported "if I died right now, that would be okay".  No known history of previous suicide attempts.  Police found a gun and knife in patient's house.  Gun has been confiscated.  Patient reports nausea.  No fevers, chills, cough, chest pain, shortness of breath, vomiting, diarrhea.  ROS: See HPI Constitutional: no fever  Eyes: no drainage  ENT: no runny nose   Cardiovascular:  no chest pain  Resp: no SOB  GI: no vomiting GU: no dysuria Integumentary: no rash  Allergy: no hives  Musculoskeletal: no leg swelling  Neurological: no slurred speech ROS otherwise negative  PAST MEDICAL HISTORY/PAST SURGICAL HISTORY:  Past Medical History:  Diagnosis Date  . ADHD (attention deficit hyperactivity disorder)   . Fracture of finger of left hand 06/2014   left 3rd finger PI fx.  . History of asthma    no current med.    MEDICATIONS:  Prior to Admission medications   Medication Sig Start Date End Date Taking? Authorizing Provider  hydrOXYzine (ATARAX/VISTARIL) 25 MG tablet Take 1 tablet (25 mg total) by mouth every 6 (six) hours. 08/24/16   Deatra Canter, FNP  methylPREDNISolone (MEDROL DOSEPAK) 4 MG TBPK tablet Take 6-5-4-3-2-1 po qd 08/24/16   Deatra Canter, FNP     ALLERGIES:  Allergies  Allergen Reactions  . Crab [Shellfish Allergy] Swelling    SWELLING OF LIPS  . Oak Bark [Quercus Robur]     SOCIAL HISTORY:  Social History   Tobacco Use  . Smoking status: Current Every Day Smoker    Types: Cigarettes  . Smokeless tobacco: Never Used  Substance Use Topics  . Alcohol use: No    FAMILY HISTORY: No family history on file.  EXAM: BP (!) 144/78 (BP Location: Left Arm)   Pulse (!) 108   Temp 98.5 F (36.9 C) (Oral)   Resp 20   SpO2 98%  CONSTITUTIONAL: Alert and oriented and responds appropriately to questions. Well-appearing; well-nourished HEAD: Normocephalic EYES: Conjunctivae clear, pupils appear equal, EOMI ENT: normal nose; moist mucous membranes NECK: Supple, no meningismus, no nuchal rigidity, no LAD  CARD: RRR; S1 and S2 appreciated; no murmurs, no clicks, no rubs, no gallops RESP: Normal chest excursion without splinting or tachypnea; breath sounds clear and equal bilaterally; no wheezes, no rhonchi, no rales, no hypoxia or respiratory distress, speaking full sentences ABD/GI: Normal bowel sounds; non-distended; soft, non-tender, no rebound, no guarding, no peritoneal signs, no hepatosplenomegaly BACK:  The back appears normal and is non-tender to palpation, there is no CVA tenderness EXT: Normal ROM in all joints; non-tender to palpation; no edema; normal capillary refill; no cyanosis, no calf tenderness or swelling, 8 cm laceration to the right shoulder    SKIN: Normal color for age and race; warm;  no rash NEURO: Moves all extremities equally, ambulatory, normal speech, no facial asymmetry PSYCH: Intoxicated.  Poorly cooperative.  Denies SI.  Makes poor eye contact.  Poor insight.  MEDICAL DECISION MAKING: Patient here with obviously self-inflicted wound to the right shoulder.  Patient has suicidal ideation today although denies it currently.  He is poorly cooperative.  Has poor insight.  I have placed patient under  involuntary commitment as he is requesting to leave but I feel he needs inpatient psychiatric treatment.  Will obtain screening labs, urine, Covid swab.  Laceration has been repaired by Suella Broad, PA-C.  Please see her note.  ED PROGRESS: Patient actively vomiting.  Likely secondary to intoxication.  Given Zofran with good relief.  Patient's alcohol level is 279.  Drug screen positive for marijuana.  Awaiting TTS evaluation.   5:45 AM  Spoke with Beverely Low with TTS.  They recommend inpatient treatment and I agree.  He is under IVC.  They have a bed available for patient at [facility].  We will send rapid Covid swab.  Shanon Brow [facility] made aware to place order.    I reviewed all nursing notes and pertinent previous records as available.  I have interpreted any EKGs, lab and urine results, imaging (as available).    CRITICAL CARE Performed by: Pryor Curia   Total critical care time: 43 minutes  Critical care time was exclusive of separately billable procedures and treating other patients.  Critical care was necessary to treat or prevent imminent or life-threatening deterioration.  Critical care was time spent personally by me on the following activities: development of treatment plan with patient and/or surrogate as well as nursing, discussions with consultants, evaluation of patient's response to treatment, examination of patient, obtaining history from patient or surrogate, ordering and performing treatments and interventions, ordering and review of laboratory studies, ordering and review of radiographic studies, pulse oximetry and re-evaluation of patient's condition.   Kaimana Lurz was evaluated in Emergency Department on 03/18/2019 for the symptoms described in the history of present illness. He was evaluated in the context of the global COVID-19 pandemic, which necessitated consideration that the patient might be at risk for infection with the SARS-CoV-2 virus that causes COVID-19.  Institutional protocols and algorithms that pertain to the evaluation of patients at risk for COVID-19 are in a state of rapid change based on information released by regulatory bodies including the CDC and federal and state organizations. These policies and algorithms were followed during the patient's care in the ED.      , Delice Bison, DO 03/18/19 [phone]

## 2019-03-18 NOTE — H&P (Signed)
Psychiatric Admission Assessment Adult  Patient Identification: Charles Henry MRN:  [id number] Date of Evaluation:  03/18/2019 Chief Complaint:  " I bought a bottle of liquor, and I rarely drink" Principal Diagnosis: Self Inflicted Injury, Alcohol Intoxication, Alcohol Induced Mood Disorder  Diagnosis:  Active Problems:   MDD (major depressive disorder), single episode, severe (HCC)  History of Present Illness: 23 year old male. Presented to ED on 11/12 via [employer]. Patient had cut self on R shoulder area ( required seven sutures)  and had posted it on social media. As per ED notes patient initially stated he " wanted to end it" when [employer] arrived . Today patient reports that he had been intoxicated with alcohol at the time , explains he had purchased a bottle of liquor on his day off work ( " I had a day off and I wanted to spice it up a little"). Admission BAL 279. States he drinks rarely and denies any pattern of alcohol abuse or dependence. States his memory of self injurious event is fragmented due to alcohol induced blackout. Reports " I only remember bits and pieces". Currently attributes above to intoxication and states " the alcohol just made me act out in this weird way, I was perfectly fine before then" He states that prior to this even he had not been feeling depressed , but does state he had some unresolved feelings of anxiety and grief related to his GF's death, who passed away in 06-04-2020from unknown causes. States he found her, but currently denies PTSD symptoms. .   Associated Signs/Symptoms: Depression Symptoms: currently denies significant neuro-vegetative symptoms leading to above event. Denies anhedonia, denies changes in sleep, appetite or energy level (Hypo) Manic Symptoms: none noted or endorsed  Anxiety Symptoms: does not endorse  Psychotic Symptoms:  Denies  PTSD Symptoms: Currently denies PTSD symptoms. Total Time spent with patient: 45 minutes  Past Psychiatric  History: denies prior psychiatric admissions, denies history of suicidal attempts, reports remote history of self cutting, but not in years . Denies history of significant depressive episodes in the past. Denies history of mania or hypomania. Denies history of psychosis. Denies history of violence.  Denies panic or agoraphobia. Currently denies PTSD symptoms/history.  Is the patient at risk to self? Yes.    Has the patient been a risk to self in the past 6 months? No.  Has the patient been a risk to self within the distant past? No.  Is the patient a risk to others? No.  Has the patient been a risk to others in the past 6 months? No.  Has the patient been a risk to others within the distant past? No.   Prior Inpatient Therapy:  as above  Prior Outpatient Therapy:  none   Alcohol Screening: 1. How often do you have a drink containing alcohol?: 2 to 3 times a week 2. How many drinks containing alcohol do you have on a typical day when you are drinking?: 1 or 2 3. How often do you have six or more drinks on one occasion?: Never AUDIT-C Score: 3 4. How often during the last year have you found that you were not able to stop drinking once you had started?: Never 5. How often during the last year have you failed to do what was normally expected from you becasue of drinking?: Never 6. How often during the last year have you needed a first drink in the morning to get yourself going after a heavy drinking session?: Never  7. How often during the last year have you had a feeling of guilt of remorse after drinking?: Less than monthly 8. How often during the last year have you been unable to remember what happened the night before because you had been drinking?: Less than monthly 9. Have you or someone else been injured as a result of your drinking?: Yes, during the last year 10. Has a relative or friend or a doctor or another health worker been concerned about your drinking or suggested you cut down?: Yes,  during the last year Alcohol Use Disorder Identification Test Final Score (AUDIT): 13 Substance Abuse History in the last 12 months:  Reports smokes cannabis occasionally, not daily. Denies pattern of alcohol abuse or dependence and states recent alcohol intoxication was an isolated event . Consequences of Substance Abuse: Denies  Previous Psychotropic Medications: Reports he had been on Vyvanse in the past , has been off for several years . No other psychiatric medications. Not taking any medications prior to admission. Psychological Evaluations:   No Past Medical History:  Denies medical illnesses, NKDA. Past Medical History:  Diagnosis Date  . ADHD (attention deficit hyperactivity disorder)   . Fracture of finger of left hand 06/2014   left 3rd finger PI fx.  . History of asthma    no current med.    Past Surgical History:  Procedure Laterality Date  . NO PAST SURGERIES    . PERCUTANEOUS PINNING Left 06/09/2014   Procedure: PERCUTANEOUS PINNING EXTREMITY, LEFT 3RD FINGER;  Surgeon: Marianna Payment, MD;  Location: [place];  Service: Orthopedics;  Laterality: Left;   Family History: reports he was adopted at age 76 .  Family Psychiatric  History: reports his biological mother has history of substance ( cocaine, heroin) abuse, no suicides in family . Has one biological brother. Tobacco Screening:  vapes tobacco product Social History: 1, no children, single, employed, lives alone, Electronics engineer at [employer] History   Substance and Sexual Activity  Alcohol Use No     Social History   Substance and Sexual Activity  Drug Use No    Additional Social History:  Allergies:  NKDA Allergies  Allergen Reactions  . Crab [Shellfish Allergy] Swelling    SWELLING OF LIPS  Crab and Lobster  . Oak Bark [Quercus Robur]    Lab Results:  Results for orders placed or performed during the hospital encounter of 03/17/19 (from the past 48 hour(s))  Rapid urine drug  screen (hospital performed)     Status: Abnormal   Collection Time: 03/17/19 11:13 PM  Result Value Ref Range   Opiates NONE DETECTED NONE DETECTED   Cocaine NONE DETECTED NONE DETECTED   Benzodiazepines NONE DETECTED NONE DETECTED   Amphetamines NONE DETECTED NONE DETECTED   Tetrahydrocannabinol POSITIVE (A) NONE DETECTED   Barbiturates NONE DETECTED NONE DETECTED    Comment: (NOTE) DRUG SCREEN FOR MEDICAL PURPOSES ONLY.  IF CONFIRMATION IS NEEDED FOR ANY PURPOSE, NOTIFY LAB WITHIN 5 DAYS. LOWEST DETECTABLE LIMITS FOR URINE DRUG SCREEN Drug Class                     Cutoff (ng/mL) Amphetamine and metabolites    1000 Barbiturate and metabolites    200 Benzodiazepine                 144 Tricyclics and metabolites     300 Opiates and metabolites        300 Cocaine and metabolites  300 THC                            50 Performed at [facility], [address] [place], [place] [address]   Comprehensive metabolic panel     Status: Abnormal   Collection Time: 03/17/19 11:20 PM  Result Value Ref Range   Sodium 141 135 - 145 mmol/L   Potassium 3.5 3.5 - 5.1 mmol/L   Chloride 101 98 - 111 mmol/L   CO2 22 22 - 32 mmol/L   Glucose, Bld 81 70 - 99 mg/dL   BUN 10 6 - 20 mg/dL   Creatinine, Ser 6.04 0.61 - 1.24 mg/dL   Calcium 9.3 8.9 - 54.0 mg/dL   Total Protein 8.0 6.5 - 8.1 g/dL   Albumin 4.9 3.5 - 5.0 g/dL   AST 22 15 - 41 U/L   ALT 19 0 - 44 U/L   Alkaline Phosphatase 67 38 - 126 U/L   Total Bilirubin 0.7 0.3 - 1.2 mg/dL   GFR calc non Af Amer >60 >60 mL/min   GFR calc Af Amer >60 >60 mL/min   Anion gap 18 (H) 5 - 15    Comment: Performed at [facility], [address] [place], [place] [address]  Ethanol     Status: Abnormal   Collection Time: 03/17/19 11:20 PM  Result Value Ref Range   Alcohol, Ethyl (B) 279 (H) <10 mg/dL    Comment: (NOTE) Lowest detectable limit for serum alcohol is 10 mg/dL. For medical purposes only. Performed at [facility], [address] [place], [place] [address]   Salicylate level     Status: None   Collection Time: 03/17/19 11:20 PM  Result Value Ref Range   Salicylate Lvl <7.0 2.8 - 30.0 mg/dL    Comment: Performed at [facility], [address] [place], [place] [address]  Acetaminophen level     Status: Abnormal   Collection Time: 03/17/19 11:20 PM  Result Value Ref Range   Acetaminophen (Tylenol), Serum <10 (L) 10 - 30 ug/mL    Comment: (NOTE) Therapeutic concentrations vary significantly. A range of 10-30 ug/mL  may be an effective concentration for many patients. However, some  are best treated at concentrations outside of this range. Acetaminophen concentrations >150 ug/mL at 4 hours after ingestion  and >50 ug/mL at 12 hours after ingestion are often associated with  toxic reactions. Performed at [facility], [address] [place], [place] [address]   cbc     Status: Abnormal   Collection Time: 03/17/19 11:20 PM  Result Value Ref Range   WBC 10.7 (H) 4.0 - 10.5 K/uL   RBC 5.23 4.22 - 5.81 MIL/uL   Hemoglobin 15.5 13.0 - 17.0 g/dL   HCT 78.4 69.6 - 29.5 %   MCV 88.5 80.0 - 100.0 fL   MCH 29.6 26.0 - 34.0 pg   MCHC 33.5 30.0 - 36.0 g/dL   RDW 28.4 13.2 - 44.0 %   Platelets 296 150 - 400 K/uL   nRBC 0.0 0.0 - 0.2 %    Comment: Performed at [facility], [address], [place], [place] [address]  SARS Coronavirus 2 by RT PCR (hospital order, performed in [facility] hospital lab) Nasopharyngeal Nasopharyngeal Swab     Status: None   Collection Time: 03/18/19  4:03 AM   Specimen: Nasopharyngeal Swab  Result  Value Ref Range   SARS Coronavirus 2 NEGATIVE NEGATIVE    Comment: (NOTE) If result is NEGATIVE SARS-CoV-2 target nucleic acids are NOT DETECTED. The SARS-CoV-2 RNA is generally detectable in upper and lower  respiratory specimens during the acute phase of infection. The lowest  concentration of SARS-CoV-2 viral copies this assay can detect is 250   copies / mL. A negative result does not preclude SARS-CoV-2 infection  and should not be used as the sole basis for treatment or other  patient management decisions.  A negative result may occur with  improper specimen collection / handling, submission of specimen other  than nasopharyngeal swab, presence of viral mutation(s) within the  areas targeted by this assay, and inadequate number of viral copies  (<250 copies / mL). A negative result must be combined with clinical  observations, patient history, and epidemiological information. If result is POSITIVE SARS-CoV-2 target nucleic acids are DETECTED. The SARS-CoV-2 RNA is generally detectable in upper and lower  respiratory specimens dur ing the acute phase of infection.  Positive  results are indicative of active infection with SARS-CoV-2.  Clinical  correlation with patient history and other diagnostic information is  necessary to determine patient infection status.  Positive results do  not rule out bacterial infection or co-infection with other viruses. If result is PRESUMPTIVE POSTIVE SARS-CoV-2 nucleic acids MAY BE PRESENT.   A presumptive positive result was obtained on the submitted specimen  and confirmed on repeat testing.  While 2019 novel coronavirus  (SARS-CoV-2) nucleic acids may be present in the submitted sample  additional confirmatory testing may be necessary for epidemiological  and / or clinical management purposes  to differentiate between  SARS-CoV-2 and other Sarbecovirus currently known to infect humans.  If clinically indicated additional testing with an alternate test  methodology [phone]) is advised. The SARS-CoV-2 RNA is generally  detectable in upper and lower respiratory sp ecimens during the acute  phase of infection. The expected result is Negative. Fact Sheet for Patients:  BoilerBrush.com.cy Fact Sheet for Healthcare  Providers: https://pope.com/ This test is not yet approved or cleared by the [place] FDA and has been authorized for detection and/or diagnosis of SARS-CoV-2 by FDA under an Emergency Use Authorization (EUA).  This EUA will remain in effect (meaning this test can be used) for the duration of the COVID-19 declaration under Section 564(b)(1) of the Act, 21 U.S.C. section 360bbb-3(b)(1), unless the authorization is terminated or revoked sooner. Performed at [facility], [address] [place], [place] [address]     Blood Alcohol level:  Lab Results  Component Value Date   ETH 279 (H) 03/17/2019   ETH 246 (H) 07/21/2014    Metabolic Disorder Labs:  No results found for: HGBA1C, MPG No results found for: PROLACTIN No results found for: CHOL, TRIG, HDL, CHOLHDL, VLDL, LDLCALC  Current Medications: Current Facility-Administered Medications  Medication Dose Route Frequency Provider Last Rate Last Dose  . acetaminophen (TYLENOL) tablet 650 mg  650 mg Oral Q6H PRN Anike, Adaku C, NP      . alum & mag hydroxide-simeth (MAALOX/MYLANTA) 200-200-20 MG/5ML suspension 30 mL  30 mL Oral Q4H PRN Anike, Adaku C, NP      . hydrOXYzine (ATARAX/VISTARIL) tablet 25 mg  25 mg Oral TID PRN Anike, Adaku C, NP      . magnesium hydroxide (MILK OF MAGNESIA) suspension 30 mL  30 mL Oral Daily PRN Anike, Adaku C, NP      . traZODone (  DESYREL) tablet 50 mg  50 mg Oral QHS PRN Anike, Adaku C, NP       PTA Medications: Medications Prior to Admission  Medication Sig Dispense Refill Last Dose  . acetaminophen (TYLENOL) 325 MG tablet Take 650 mg by mouth every 6 (six) hours as needed for mild pain or headache.       Musculoskeletal: Strength & Muscle Tone: within normal limits no tremors, no diaphoresis, no restlessness or agitation Gait & Station: normal Patient leans: N/A  Psychiatric Specialty Exam: Physical Exam  Review of Systems  Constitutional: Negative for  chills and fever.  HENT: Negative.   Eyes: Negative.   Respiratory: Negative for cough and shortness of breath.   Cardiovascular: Negative for chest pain.  Gastrointestinal: Negative for nausea and vomiting.  Genitourinary: Negative.   Musculoskeletal: Negative.   Skin: Negative for rash.  Neurological: Negative for seizures.  Endo/Heme/Allergies: Negative.   Psychiatric/Behavioral: Positive for substance abuse.  All other systems reviewed and are negative.    Blood pressure 128/90, pulse 100, temperature 98.1 F (36.7 C), temperature source Oral, resp. rate 16, height  (1.753 m), weight 57.6 kg.Body mass index is 18.75 kg/m.  General Appearance: Well Groomed  Eye Contact:  Good  Speech:  Normal Rate  Volume:  Normal  Mood:  denies depression, states " my mood is OK"  Affect:  appropriate, vaguely anxious, not constricted or sad   Thought Process:  Linear and Descriptions of Associations: Intact  Orientation:  Full (Time, Place, and Person)  Thought Content:  no hallucinations, no delusions, not internally preoccupied   Suicidal Thoughts:  No denies suicidal or self injurious ideations at this time and contracts for safety on unit, denies homicidal or violent ideations  Homicidal Thoughts:  No  Memory:  recent and remote gorssly intact, as noted, reports limited memory of self injurious event   Judgement:  Fair  Insight:  Fair  Psychomotor Activity:  Normal- no restlessness or psychomotor agitation, no tremors or diaphoresis, presents calm  Concentration:  Good   Recall:  Good  Fund of Knowledge:  Good  Language:  Good  Akathisia:  Negative  Handed:  Right  AIMS (if indicated):     Assets:  Desire for Improvement Resilience  ADL's:  Intact  Cognition:  WNL  Sleep:       Treatment Plan Summary: Daily contact with patient to assess and evaluate symptoms and progress in treatment, Medication management, Plan inpatient treatment  and medications as below  Observation  Level/Precautions:  15 minute checks  Laboratory:  as needed   Psychotherapy:  Milieu, group therapy  Medications:  We discussed options, at this time patient reports above self injurious event was isolated and in the context of alcohol intoxication. States " I am not depressed, today I am back to my normal self ". Will start Ativan PRN for alcohol WDL if needed   Consultations:  As needed   Discharge Concerns:  -   Estimated LOS: 3-4 days   Other:     Physician Treatment Plan for Primary Diagnosis:  Alcohol Induced Mood Disorder  Long Term Goal(s): Improvement in symptoms so as ready for discharge  Short Term Goals: Ability to identify changes in lifestyle to reduce recurrence of condition will improve, Ability to verbalize feelings will improve, Ability to disclose and discuss suicidal ideas, Ability to demonstrate self-control will improve, Ability to identify and develop effective coping behaviors will improve and Ability to maintain clinical measurements within normal limits will  improve  Physician Treatment Plan for Secondary Diagnosis: Self Inflicted Injury Long Term Goal(s): Improvement in symptoms so as ready for discharge  Short Term Goals: Ability to identify changes in lifestyle to reduce recurrence of condition will improve, Ability to verbalize feelings will improve, Ability to disclose and discuss suicidal ideas, Ability to demonstrate self-control will improve, Ability to identify and develop effective coping behaviors will improve and Ability to maintain clinical measurements within normal limits will improve  I certify that inpatient services furnished can reasonably be expected to improve the patient's condition.    Craige Cotta, MD 11/13/20204:15 PM

## 2019-03-18 NOTE — [facility] Group Notes (Signed)
LCSW Group Therapy Note  03/18/2019 3:15 PM  Type of Therapy/Topic: Group Therapy: Feelings about Diagnosis  Participation Level: Active   Description of Group:  This group will allow patients to explore their thoughts and feelings about diagnoses they have received. Patients will be guided to explore their level of understanding and acceptance of these diagnoses. Facilitator will encourage patients to process their thoughts and feelings about the reactions of others to their diagnosis and will guide patients in identifying ways to discuss their diagnosis with significant others in their lives. This group will be process-oriented, with patients participating in exploration of their own experiences, giving and receiving support, and processing challenge from other group members.  Therapeutic Goals: 1. Patient will demonstrate understanding of diagnosis as evidenced by identifying two or more symptoms of the disorder 2. Patient will be able to express two feelings regarding the diagnosis 3. Patient will demonstrate their ability to communicate their needs through discussion and/or role play  Summary of Patient Progress: [name] listened attentively to his peers during the first half of group and became more vocal later. [name] shared that he started using alcohol following the death of his girlfriend and that this admission is a "wake up call." He expressed eagerness to participate in treatment.   Therapeutic Modalities:  Cognitive Behavioral Therapy Brief Therapy Feelings Identification   Stephanie Acre, MSW, [place] Social Worker
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
## 2019-03-19 NOTE — Progress Notes (Addendum)
D. Pt presents with an anxious affect/mood- cooperative, but fidgety behavior- observed in the milieu interacting appropriately with peers.Per pt's self- inventory, pt rated his depression, hopelessness and anxiety all 0's.  Pt reports that he is anxious to get back home- states, "I have school and work.Marland KitchenMarland KitchenI learned my lesson.Marland Kitchen alcohol isn't good for me".   Pt currently denies SI/HI and AVH  A. Labs and vitals monitored. Pt supported emotionally and encouraged to express concerns and ask questions.   R. Pt remains safe with 15 minute checks. Will continue POC.

## 2019-03-19 NOTE — Progress Notes (Signed)
Charles Henry NOVEL CORONAVIRUS (COVID-19) DAILY CHECK-OFF SYMPTOMS - answer yes or no to each - every day NO YES  Have you had a fever in the past 24 hours?  . Fever (Temp > 37.80C / 100F) X   Have you had any of these symptoms in the past 24 hours? . New Cough .  Sore Throat  .  Shortness of Breath .  Difficulty Breathing .  Unexplained Body Aches   X   Have you had any one of these symptoms in the past 24 hours not related to allergies?   . Runny Nose .  Nasal Congestion .  Sneezing   X   If you have had runny nose, nasal congestion, sneezing in the past 24 hours, has it worsened?  X   EXPOSURES - check yes or no X   Have you traveled outside the state in the past 14 days?  X   Have you been in contact with someone with a confirmed diagnosis of COVID-19 or PUI in the past 14 days without wearing appropriate PPE?  X   Have you been living in the same home as a person with confirmed diagnosis of COVID-19 or a PUI (household contact)?    X   Have you been diagnosed with COVID-19?    X              What to do next: Answered NO to all: Answered YES to anything:   Proceed with unit schedule Follow the [facility] Inpatient Flowsheet.   

## 2019-03-19 NOTE — Progress Notes (Signed)
Adult Psychoeducational Group Note  Date:  03/19/2019 Time:  8:58 PM  Group Topic/Focus:  Wrap-Up Group:   The focus of this group is to help patients review their daily goal of treatment and discuss progress on daily workbooks.  Participation Level:  Active  Participation Quality:  Appropriate  Affect:  Appropriate  Cognitive:  Appropriate  Insight: Appropriate  Engagement in Group:  Engaged  Modes of Intervention:  Discussion  Additional Comments:  Pt said his day was a 15. His goal for today understand all is gonna be ok. He did achieve. The coping skills he used playing cards chatting with the group and laughing joking.  Lenice Llamas Long 03/19/2019, 8:58 PM

## 2019-03-19 NOTE — Progress Notes (Signed)
[facility] MD Progress Note  03/19/2019 2:15 PM Charles Henry  MRN:  [id number] Subjective: Patient reports "I think I am feeling okay".  Currently denies suicidal ideations or any self-injurious ideations.  Describes his mood as "okay", denies depression.  Objective: I have reviewed chart notes and met with patient. 23 year old male.  Presented to ED on 11/12 via [facility] following a self-inflicted cut to his right shoulder which required several sutures.  Had posted event on social media.  Report in chart is that patient stated he "wanted to end it all" when [facility] first arrived on scene.  Admission BAL 279.  Patient reports he had consumed alcohol heavily on day of admission but denies any pattern of alcohol abuse/dependence and states that drinking episode was isolated.  Reports that his memory of above self-injurious event is fragmented/limited due to alcoholic blackout at the time.  He does deny any suicidal ideations or significant depression prior to intoxication and attributes his self-injurious ideations to being under the influence of alcohol.  He does report some unresolved feelings of grief and anxiety related to his girlfriend's death who passed away in 2018/09/29.  Today patient presents fully alert, attentive, calm, pleasant.  No symptoms of alcohol withdrawal.  No tremors, no diaphoresis, no restlessness, no psychomotor agitation.  Denies headache or any visual disturbances. He denies feeling depressed and minimizes/denies neurovegetative symptoms of depression.  He denies suicidal ideations and as above attributes his recent self-injurious behavior to alcohol intoxication. Behavior on unit in good control, visible in dayroom and interacting with peers. Patient is not currently on any standing psychiatric medications Principal Problem: Alcohol induced mood disorder Diagnosis: Active Problems:   MDD (major depressive disorder), single episode, severe (HCC)  Total Time spent with patient: 20  minutes  Past Psychiatric History:  Past Medical History:  Past Medical History:  Diagnosis Date  . ADHD (attention deficit hyperactivity disorder)   . Fracture of finger of left hand 06/2014   left 3rd finger PI fx.  . History of asthma    no current med.    Past Surgical History:  Procedure Laterality Date  . NO PAST SURGERIES    . PERCUTANEOUS PINNING Left 06/09/2014   Procedure: PERCUTANEOUS PINNING EXTREMITY, LEFT 3RD FINGER;  Surgeon: Marianna Payment, MD;  Location: [facility];  Service: Orthopedics;  Laterality: Left;   Family History: History reviewed. No pertinent family history. Family Psychiatric  History:  Social History:  Social History   Substance and Sexual Activity  Alcohol Use No     Social History   Substance and Sexual Activity  Drug Use No    Social History   Socioeconomic History  . Marital status: Single    Spouse name: Not on file  . Number of children: Not on file  . Years of education: Not on file  . Highest education level: Not on file  Occupational History  . Not on file  Social Needs  . Financial resource strain: Not on file  . Food insecurity    Worry: Not on file    Inability: Not on file  . Transportation needs    Medical: Not on file    Non-medical: Not on file  Tobacco Use  . Smoking status: Current Every Day Smoker    Types: Cigarettes  . Smokeless tobacco: Never Used  Substance and Sexual Activity  . Alcohol use: No  . Drug use: No  . Sexual activity: Not on file  Lifestyle  . Physical activity  Days per week: Not on file    Minutes per session: Not on file  . Stress: Not on file  Relationships  . Social Herbalist on phone: Not on file    Gets together: Not on file    Attends religious service: Not on file    Active member of club or organization: Not on file    Attends meetings of clubs or organizations: Not on file    Relationship status: Not on file  Other Topics Concern  . Not on  file  Social History Narrative  . Not on file   Additional Social History:   Sleep: Good  Appetite:  Good  Current Medications: Current Facility-Administered Medications  Medication Dose Route Frequency Provider Last Rate Last Dose  . acetaminophen (TYLENOL) tablet 650 mg  650 mg Oral Q6H PRN Anike, Adaku C, NP      . alum & mag hydroxide-simeth (MAALOX/MYLANTA) 200-200-20 MG/5ML suspension 30 mL  30 mL Oral Q4H PRN Anike, Adaku C, NP      . hydrOXYzine (ATARAX/VISTARIL) tablet 25 mg  25 mg Oral TID PRN Anike, Adaku C, NP   25 mg at 03/18/19 2319  . magnesium hydroxide (MILK OF MAGNESIA) suspension 30 mL  30 mL Oral Daily PRN Anike, Adaku C, NP      . traZODone (DESYREL) tablet 50 mg  50 mg Oral QHS PRN Anike, Adaku C, NP   50 mg at 03/18/19 2320    Lab Results:  Results for orders placed or performed during the hospital encounter of 03/17/19 (from the past 48 hour(s))  Rapid urine drug screen (hospital performed)     Status: Abnormal   Collection Time: 03/17/19 11:13 PM  Result Value Ref Range   Opiates NONE DETECTED NONE DETECTED   Cocaine NONE DETECTED NONE DETECTED   Benzodiazepines NONE DETECTED NONE DETECTED   Amphetamines NONE DETECTED NONE DETECTED   Tetrahydrocannabinol POSITIVE (A) NONE DETECTED   Barbiturates NONE DETECTED NONE DETECTED    Comment: (NOTE) DRUG SCREEN FOR MEDICAL PURPOSES ONLY.  IF CONFIRMATION IS NEEDED FOR ANY PURPOSE, NOTIFY LAB WITHIN 5 DAYS. LOWEST DETECTABLE LIMITS FOR URINE DRUG SCREEN Drug Class                     Cutoff (ng/mL) Amphetamine and metabolites    1000 Barbiturate and metabolites    200 Benzodiazepine                 161 Tricyclics and metabolites     300 Opiates and metabolites        300 Cocaine and metabolites        300 THC                            50 Performed at [facility], [place] [address], [place], [place] [address]   Comprehensive metabolic panel     Status: Abnormal   Collection Time: 03/17/19 11:20 PM   Result Value Ref Range   Sodium 141 135 - 145 mmol/L   Potassium 3.5 3.5 - 5.1 mmol/L   Chloride 101 98 - 111 mmol/L   CO2 22 22 - 32 mmol/L   Glucose, Bld 81 70 - 99 mg/dL   BUN 10 6 - 20 mg/dL   Creatinine, Ser 0.96 0.61 - 1.24 mg/dL   Calcium 9.3 8.9 - 10.3 mg/dL   Total Protein 8.0 6.5 - 8.1 g/dL  Albumin 4.9 3.5 - 5.0 g/dL   AST 22 15 - 41 U/L   ALT 19 0 - 44 U/L   Alkaline Phosphatase 67 38 - 126 U/L   Total Bilirubin 0.7 0.3 - 1.2 mg/dL   GFR calc non Af Amer >60 >60 mL/min   GFR calc Af Amer >60 >60 mL/min   Anion gap 18 (H) 5 - 15    Comment: Performed at [facility] [address] [place], [place] [address]  Ethanol     Status: Abnormal   Collection Time: 03/17/19 11:20 PM  Result Value Ref Range   Alcohol, Ethyl (B) 279 (H) <10 mg/dL    Comment: (NOTE) Lowest detectable limit for serum alcohol is 10 mg/dL. For medical purposes only. Performed at [facility], [place] [address], [place], [place] [address]   Salicylate level     Status: None   Collection Time: 03/17/19 11:20 PM  Result Value Ref Range   Salicylate Lvl <6.0 2.8 - 30.0 mg/dL    Comment: Performed at [facility] [address], [place], [place] [address]  Acetaminophen level     Status: Abnormal   Collection Time: 03/17/19 11:20 PM  Result Value Ref Range   Acetaminophen (Tylenol), Serum <10 (L) 10 - 30 ug/mL    Comment: (NOTE) Therapeutic concentrations vary significantly. A range of 10-30 ug/mL  may be an effective concentration for many patients. However, some  are best treated at concentrations outside of this range. Acetaminophen concentrations >150 ug/mL at 4 hours after ingestion  and >50 ug/mL at 12 hours after ingestion are often associated with  toxic reactions. Performed at [facility], [place] [address] [place], [place] [address]   cbc     Status: Abnormal   Collection Time: 03/17/19 11:20 PM  Result Value Ref Range   WBC 10.7 (H) 4.0 - 10.5 K/uL   RBC  5.23 4.22 - 5.81 MIL/uL   Hemoglobin 15.5 13.0 - 17.0 g/dL   HCT 46.3 39.0 - 52.0 %   MCV 88.5 80.0 - 100.0 fL   MCH 29.6 26.0 - 34.0 pg   MCHC 33.5 30.0 - 36.0 g/dL   RDW 13.8 11.5 - 15.5 %   Platelets 296 150 - 400 K/uL   nRBC 0.0 0.0 - 0.2 %    Comment: Performed at [facility], [place] [address] [place], [place] [address]  SARS Coronavirus 2 by RT PCR (hospital order, performed in [facility] hospital lab) Nasopharyngeal Nasopharyngeal Swab     Status: None   Collection Time: 03/18/19  4:03 AM   Specimen: Nasopharyngeal Swab  Result Value Ref Range   SARS Coronavirus 2 NEGATIVE NEGATIVE    Comment: (NOTE) If result is NEGATIVE SARS-CoV-2 target nucleic acids are NOT DETECTED. The SARS-CoV-2 RNA is generally detectable in upper and lower  respiratory specimens during the acute phase of infection. The lowest  concentration of SARS-CoV-2 viral copies this assay can detect is 250  copies / mL. A negative result does not preclude SARS-CoV-2 infection  and should not be used as the sole basis for treatment or other  patient management decisions.  A negative result may occur with  improper specimen collection / handling, submission of specimen other  than nasopharyngeal swab, presence of viral mutation(s) within the  areas targeted by this assay, and inadequate number of viral copies  (<250 copies / mL). A negative result must be combined with clinical  observations,  patient history, and epidemiological information. If result is POSITIVE SARS-CoV-2 target nucleic acids are DETECTED. The SARS-CoV-2 RNA is generally detectable in upper and lower  respiratory specimens dur ing the acute phase of infection.  Positive  results are indicative of active infection with SARS-CoV-2.  Clinical  correlation with patient history and other diagnostic information is  necessary to determine patient infection status.  Positive results do  not rule out bacterial infection or co-infection with  other viruses. If result is PRESUMPTIVE POSTIVE SARS-CoV-2 nucleic acids MAY BE PRESENT.   A presumptive positive result was obtained on the submitted specimen  and confirmed on repeat testing.  While 2019 novel coronavirus  (SARS-CoV-2) nucleic acids may be present in the submitted sample  additional confirmatory testing may be necessary for epidemiological  and / or clinical management purposes  to differentiate between  SARS-CoV-2 and other Sarbecovirus currently known to infect humans.  If clinically indicated additional testing with an alternate test  methodology [phone]) is advised. The SARS-CoV-2 RNA is generally  detectable in upper and lower respiratory sp ecimens during the acute  phase of infection. The expected result is Negative. Fact Sheet for Patients:  StrictlyIdeas.no Fact Sheet for Healthcare Providers: BankingDealers.co.za This test is not yet approved or cleared by the [place] FDA and has been authorized for detection and/or diagnosis of SARS-CoV-2 by FDA under an Emergency Use Authorization (EUA).  This EUA will remain in effect (meaning this test can be used) for the duration of the COVID-19 declaration under Section 564(b)(1) of the Act, 21 U.S.C. section 360bbb-3(b)(1), unless the authorization is terminated or revoked sooner. Performed at [facility], [place] [address], [place], [place] [address]     Blood Alcohol level:  Lab Results  Component Value Date   ETH 279 (H) 03/17/2019   ETH 246 (H) 03/21/3566    Metabolic Disorder Labs: No results found for: HGBA1C, MPG No results found for: PROLACTIN No results found for: CHOL, TRIG, HDL, CHOLHDL, VLDL, LDLCALC  Physical Findings: AIMS: Facial and Oral Movements Muscles of Facial Expression: None, normal Lips and Perioral Area: None, normal Jaw: None, normal Tongue: None, normal,Extremity Movements Upper (arms, wrists, hands, fingers): None,  normal Lower (legs, knees, ankles, toes): None, normal, Trunk Movements Neck, shoulders, hips: None, normal, Overall Severity Severity of abnormal movements (highest score from questions above): None, normal Incapacitation due to abnormal movements: None, normal Patient's awareness of abnormal movements (rate only patient's report): No Awareness, Dental Status Current problems with teeth and/or dentures?: No Does patient usually wear dentures?: No  CIWA:    COWS:     Musculoskeletal: Strength & Muscle Tone: within normal limits Gait & Station: normal Patient leans: N/A  Psychiatric Specialty Exam: Physical Exam  ROS no headache, no chest pain, no shortness of breath, no nausea, no vomiting.  Reports that right shoulder laceration is healing well, denies increased pain, exudate or any active bleeding .   Blood pressure 122/78, pulse 61, temperature 98.1 F (36.7 C), temperature source Oral, resp. rate 16, height '5\' 9"'$  (1.753 m), weight 57.6 kg.Body mass index is 18.75 kg/m.  General Appearance: Well Groomed  Eye Contact:  Good  Speech:  Normal Rate  Volume:  Normal  Mood:  Currently minimizes depression, describes mood as "all right" and does present euthymic at this time  Affect:  Appropriate and Full Range  Thought Process:  Linear and Descriptions of Associations: Intact  Orientation:  Other:  Fully alert and attentive  Thought Content:  No hallucinations, no delusions  Suicidal Thoughts:  No currently denies suicidal or self-injurious ideations, denies any homicidal or violent ideations.  Homicidal Thoughts:  No  Memory:  Recent and remote grossly intact, as noted reports his memory of self-injurious event is minimal  Judgement:  Other:  Fair/improving  Insight:  Fair  Psychomotor Activity:  Normal no tremors, no diaphoresis, no restlessness  Concentration:  Concentration: Good and Attention Span: Good  Recall:  Good  Fund of Knowledge:  Good  Language:  Good  Akathisia:   Negative  Handed:  Right  AIMS (if indicated):     Assets:  Communication Skills Desire for Improvement Resilience  ADL's:  Intact  Cognition:  WNL  Sleep:      Assessment:  23 year old male.  Presented to ED on 11/12 via [facility] following a self-inflicted cut to his right shoulder which required several sutures.  Had posted event on social media.  Report in chart is that patient stated he "wanted to end it all" when [facility] first arrived on scene.  Admission BAL 279.  Patient reports he had consumed alcohol heavily on day of admission but denies any pattern of alcohol abuse/dependence and states that drinking episode was isolated.  Reports that his memory of above self-injurious event is fragmented/limited due to alcoholic blackout at the time.  He does deny any suicidal ideations or significant depression prior to intoxication and attributes his self-injurious ideations to being under the influence of alcohol.  He does report some unresolved feelings of grief and anxiety related to his girlfriend's death who passed away in 09-23-2018.  At this time patient presents calm, pleasant on approach, behavior in good control.  He reiterates that recent self-injurious event occurred in the context of alcohol-related blackout, and that prior to drinking episode he was not feeling particularly depressed or having any suicidal or self-injurious thoughts.  At this time presents euthymic with a full range of affect, hopeful for discharge soon as he states he needs to get back to work soon.  Patient is not currently on any standing psychiatric medications.  We have reviewed, at this time reports feeling euthymic without significant psychiatric symptoms and no clear indication to start an antidepressant at this time. Treatment Plan Summary: Daily contact with patient to assess and evaluate symptoms and progress in treatment, Medication management, Plan Inpatient treatment and Medication as below Encourage group and milieu  participation Continue Vistaril 25 mg 3 times daily as needed for anxiety Continue Trazodone 50 mg nightly as needed for insomnia Treatment team working on disposition planning options Jenne Campus, MD 03/19/2019, 2:15 PM

## 2019-03-19 NOTE — B[facility] Group Notes (Signed)
LCSW Group Therapy Note  Date/Time:  03/19/2019   10:00AM-11:00AM  Type of Therapy and Topic:  Group Therapy:  Fears and Unhealthy/Healthy Coping Skills  Participation Level:  Active   Description of Group:  The focus of this group was to discuss some of the prevalent fears that patients experience, and to identify the commonalities among group members.  A fun exercise was used to initiate the discussion, followed by writing on the white board a group-generated list of unhealthy coping and healthy coping techniques to deal with each fear.    Therapeutic Goals: 1. Patient will be able to distinguish between healthy and unhealthy coping skills 2. Patient will identify and describe 3 fears they experience 3. Patient will identify one positive coping strategy for each fear they experience 4. Patient will respond empathetically to peers' statements regarding fears they experience  Summary of Patient Progress:  The patient expressed himself throughout group freely and appeared to be invested in the group.  After group he was quite irritable when he tried to explain the reason he needs out of the hospital immediately and instead was told he really should call his boss and explain the reason he was not there yesterday and today.  Therapeutic Modalities Cognitive Behavioral Therapy Motivational Interviewing  Selmer Dominion, LCSW

## 2019-03-19 NOTE — Progress Notes (Signed)
[facility] Group Notes:  (Nursing/MHT/Case Management/Adjunct)  Date:  03/19/2019  Time:  1345   Type of Therapy:  Nurse Education Goals for Hospitalization   Participation Level:  Active  Participation Quality:  Appropriate  Affect:  Appropriate  Cognitive:  Appropriate  Insight:  Appropriate  Engagement in Group:  Engaged  Modes of Intervention:  Discussion and Education  Summary of Progress/Problems: 

## 2019-03-20 DIAGNOSIS — Z7289 Other problems related to lifestyle: Secondary | ICD-10-CM

## 2019-03-20 DIAGNOSIS — IMO0002 Reserved for concepts with insufficient information to code with codable children: Secondary | ICD-10-CM

## 2019-03-20 DIAGNOSIS — F1094 Alcohol use, unspecified with alcohol-induced mood disorder: Secondary | ICD-10-CM

## 2019-03-20 MED ORDER — TRAZODONE HCL 50 MG PO TABS: 50.0000 mg | ORAL_TABLET | Freq: Every evening | ORAL | 0 refills | Status: DC | PRN

## 2019-03-20 NOTE — Progress Notes (Signed)
Pt discharged to lobby. Pt was stable and appreciative at that time. All papers and prescriptions were given and valuables returned. Verbal understanding expressed. Denies SI/HI and A/VH. Pt given opportunity to express concerns and ask questions.  

## 2019-03-20 NOTE — Progress Notes (Signed)
D. Pt observed in the dayroom this am interacting appropriately with peers-- pt was friendly upon approach. Per pt's self inventory, pt rated his depression, hopelessness and anxiety all 0's. Pt writes that his goal today is "getting back into the world".  Pt currently denies pain,SI/HI and AVH and reports that he's tolerating medication well A. Labs and vitals monitored. Pt given and educated on medications. Pt supported emotionally and encouraged to express concerns and ask questions.   R. Pt remains safe with 15 minute checks. Will continue POC.

## 2019-03-20 NOTE — B[facility] Group Notes (Signed)
[place] LCSW Group Therapy Note  03/20/2019  10:00-11:00AM  Type of Therapy and Topic:  Group Therapy:  A Hero Worthy of Support  Participation Level:  Active   Description of Group:  Patients in this group were introduced to the concept that additional supports including self-support are an essential part of recovery.  Matching needs with supports to help fulfill those needs was explained.  A song "I Got To Live" was played for the group and was followed by a discussion of what it meant to participants.   The consensus was that the message was to give themselves permission to see happiness in life.  A song entitled "My Own Hero" was played and a group discussion ensued in which patients stated it inspired them to help themselves in order to succeed, because other people cannot achieve their goals such as sobriety or stability for them.  A song was played called "I Am Enough" which led to a discussion about being willing to believe we are worth the effort of being a self-support.   Therapeutic Goals: 1)  demonstrate the importance of being a key part of one's own support system 2)  discuss various available supports 3)  encourage patient to use music as part of their self-support and focus on goals 4)  elicit ideas from patients about supports that need to be added   Summary of Patient Progress:  The patient expressed that he realizes he needs to be a better self-support by staying away from alcohol.  He listened attentively.  Therapeutic Modalities:   Motivational Interviewing Activity  Maretta Los

## 2019-03-20 NOTE — B[facility] Suicide Risk Assessment (Addendum)
National INPATIENT:  Family/Significant Other Suicide Prevention Education  Suicide Prevention Education:  Education Completed; [facility] Mother Charles Henry [phone],  (name of family member/significant other) has been identified by the patient as the family member/significant other with whom the patient will be residing, and identified as the person(s) who will aid the patient in the event of a mental health crisis (suicidal ideations/suicide attempt).  With written consent from the patient, the family member/significant other has been provided the following suicide prevention education, prior to the and/or following the discharge of the patient.  Charles Henry mother has taken classes at [facility] about suicide prevention and knows what to watch for, although we went ahead and reviewed the information anyway.  She is concerned that patient is refusing medicines, but stated it does sound "just like him."   She was provided with the names and locations of [facility] [place] if needed, if he changes his mind about med mgmt or therapy.  The suicide prevention education provided includes the following:  Suicide risk factors  Suicide prevention and interventions  National Suicide Hotline telephone number  [facility] assessment telephone number  [facility] Emergency Assistance [place] and/or Residential Mobile Crisis Unit telephone number  Request made of family/significant other to:  Remove weapons (e.g., guns, rifles, knives), all items previously/currently identified as safety concern.    Remove drugs/medications (over-the-counter, prescriptions, illicit drugs), all items previously/currently identified as a safety concern.  The family member/significant other verbalizes understanding of the suicide prevention education information provided.  The family member/significant other agrees to remove the items of  safety concern listed above.  Charles Henry 03/20/2019, 12:43 PM

## 2019-03-20 NOTE — Progress Notes (Signed)
Pt irritable this morning,  states he did not sleep well last night.  He stated that there was "too much noise".  Spoke with patient and asked if he would like to have earplugs tonight but patient states he does not want to wear earplugs he would like to be discharged.  Pt states "either today or tomorrow".  Pt wishes to return to work and denies suicidal ideation.  Pt did not ask for prn sleep medication last night, if he is still here tonight I will speak to him about this.  Pt does remain safe on the unit, quietly sitting in dayroom awaiting breakfast.

## 2019-03-20 NOTE — [facility] Suicide Risk Assessment (Signed)
[facility] Discharge Suicide Risk Assessment   Principal Problem: MDD (major depressive disorder), single episode, severe ([place]) Discharge Diagnoses: Principal Problem:   MDD (major depressive disorder), single episode, severe ([place])   Total Time spent with patient: 30 minutes  Musculoskeletal: Strength & Muscle Tone: within normal limits no tremors, no diaphoresis, no restlessness or agitation Gait & Station: normal Patient leans: N/A  Psychiatric Specialty Exam: ROS denies headache, denies chest pain, no shortness of breath, no nausea, no vomiting, no fever, no chills  Blood pressure 125/74, pulse (!) 57, temperature (!) 97.3 F (36.3 C), temperature source Oral, resp. rate 16, height 5\' 9"  (1.753 m), weight 57.6 kg.Body mass index is 18.75 kg/m.  General Appearance: Well Groomed  Eye Contact::  Good  Speech:  Normal [id number]  Volume:  Normal  Mood:  Reports improved/normalized mood.  Currently denies feeling depressed.  Presents euthymic.  Affect:  Appropriate and full in range  Thought Process:  Linear and Descriptions of Associations: Intact  Orientation:  Full (Time, Place, and Person)  Thought Content:  No hallucinations, no delusions  Suicidal Thoughts:  No denies suicidal or self-injurious ideations, no homicidal or violent ideations  Homicidal Thoughts:  No  Memory:  Recent and remote grossly intact  Judgement:  Other:  Improving   Insight:  Fair/improving  Psychomotor Activity:  Normal  Concentration:  Good  Recall:  Good  Fund of Knowledge:Good  Language: Good  Akathisia:  Negative  Handed:  Right  AIMS (if indicated):     Assets:  Desire for Improvement Resilience  Sleep:  Number of Hours: 6  Cognition: WNL  ADL's:  Intact   Mental Status Per Nursing Assessment::   On Admission:  Self-harm behaviors  Demographic Factors:  23 year old, no children, single, employed, college student  Loss Factors: Self-injurious behavior occurring in the context of alcohol  intoxication  Historical Factors: No prior psychiatric admissions, no prior self-injurious or suicidal attempts  Risk Reduction Factors:   Positive coping skills or problem solving skills and Invested in college  Continued Clinical Symptoms:  Today patient presents alert, attentive, calm, pleasant on approach, behavior in good control, mood is described as normal, denies feeling depressed, presents euthymic, affect is appropriate and full in range, no thought disorder, no suicidal or self-injurious ideations, no homicidal or violent ideations, no psychotic symptoms, future oriented. Behavior on unit in good control, interacting appropriately with peers. Patient is not currently on any standing psychiatric medications. With patient's expressed consent I have spoken with his adoptive mother/godmother.  She has been speaking with patient via phone regularly since his admission.  She corroborates that patient presents improved, at baseline, and is in agreement with discharge today.  States she will be picking them up later today. Patient is expressing insight regarding the negative impact that alcohol has on his behavior and as above, states that recent self-injurious episode occurred in the context of alcohol induced blackouts/intoxication.  States "I know alcohol is bad for me, I am not going to drink again".  Denies any cravings. Self-inflicted laceration on right shoulder area is reported to be healing well.  No increased erythema or pain in area.  No bleeding or exudate.  Plans to have sutures removed at 7 to 8 days.  Cognitive Features That Contribute To Risk:  No gross cognitive deficits noted upon discharge. Is alert , attentive, and oriented x 3    Suicide Risk:  Mild:  Suicidal ideation of limited frequency, intensity, duration, and specificity.  There are no  identifiable plans, no associated intent, mild dysphoria and related symptoms, good self-control (both objective and subjective  assessment), few other risk factors, and identifiable protective factors, including available and accessible social support.  Follow-up Information    Declines Follow-up Follow up.   Why: Patient declines medication management and/or therapy follow-up referrals.  He has been given information on Alcoholics Anonymous and is interested.  He is encouraged to go to 90 meetings in 90 days after discharge.          Plan Of Care/Follow-up recommendations:  Activity:  As tolerated Diet:  Regular Tests:  NA Other:  See below  Patient is expressing readiness for discharge and is leaving unit in good spirits.  There are no current grounds for involuntary commitment.  Patient's adoptive mother will be picking him up later today.   Craige Cotta, MD 03/20/2019, 12:52 PM

## 2019-03-20 NOTE — Progress Notes (Signed)
  [facility] Adult Case Management Discharge Plan :  Will you be returning to the same living situation after discharge:  Yes,  alone At discharge, do you have transportation home?: Yes,  mother Do you have the ability to pay for your medications: Yes,  has income and insurance  Release of information consent forms completed and emailed to Medical Records, then turned in  Patient to Follow up at: Follow-up Information    Declines Follow-up Follow up.   Why: Patient declines medication management and/or therapy follow-up referrals.  He has been given information on Alcoholics Anonymous and is interested.  He is encouraged to go to 90 meetings in 90 days after discharge.          Next level of care provider has access to [place] and Suicide Prevention discussed: Yes,  with foster mother Inda Castle [phone]     Has patient been referred to the Quitline?: Patient refused referral  Patient has been referred for addiction treatment: Pt. refused referral  Maretta Los, LCSW 03/20/2019, 12:20 PM

## 2019-03-20 NOTE — Progress Notes (Signed)
[place] NOVEL CORONAVIRUS (COVID-19) DAILY CHECK-OFF SYMPTOMS - answer yes or no to each - every day NO YES  Have you had a fever in the past 24 hours?  . Fever (Temp > 37.80C / 100F) X   Have you had any of these symptoms in the past 24 hours? . New Cough .  Sore Throat  .  Shortness of Breath .  Difficulty Breathing .  Unexplained Body Aches   X   Have you had any one of these symptoms in the past 24 hours not related to allergies?   . Runny Nose .  Nasal Congestion .  Sneezing   X   If you have had runny nose, nasal congestion, sneezing in the past 24 hours, has it worsened?  X   EXPOSURES - check yes or no X   Have you traveled outside the state in the past 14 days?  X   Have you been in contact with someone with a confirmed diagnosis of COVID-19 or PUI in the past 14 days without wearing appropriate PPE?  X   Have you been living in the same home as a person with confirmed diagnosis of COVID-19 or a PUI (household contact)?    X   Have you been diagnosed with COVID-19?    X              What to do next: Answered NO to all: Answered YES to anything:   Proceed with unit schedule Follow the [facility] Inpatient Flowsheet.   

## 2019-03-20 NOTE — Discharge Summary (Addendum)
Physician Discharge Summary Note  Patient:  Charles Henry is an 23 y.o., male MRN:  [id number] DOB:  August 03, 1995 Patient phone:  [phone] (home)  Patient address:   [place] [address],  Total Time spent with patient: 15 minutes  Date of Admission:  03/18/2019 Date of Discharge: 03/20/2019  Reason for Admission:  Per admission assessment: 23 year old male. Presented to ED on 11/12 via [employer]. Patient had cut self on R shoulder area ( required seven sutures)  and had posted it on social media. As per ED notes patient initially stated he " wanted to end it" when [employer] arrived .Today patient reports that he had been intoxicated with alcohol at the time , explains he had purchased a bottle of liquor on his day off work ( " I had a day off and I wanted to spice it up a little"). Admission BAL 279. States he drinks rarely and denies any pattern of alcohol abuse or dependence. States his memory of self injurious event is fragmented due to alcohol induced blackout. Reports " I only remember bits and pieces". Currently attributes above to intoxication and states " the alcohol just made me act out in this weird way, I was perfectly fine before then"He states that prior to this even he had not been feeling depressed , but does state he had some unresolved feelings of anxiety and grief related to his GF's death, who passed away in 09-26-2018, from unknown causes. States he found her, but currently denies PTSD symptoms. .   Principal Problem: MDD (major depressive disorder), single episode, severe ([place]) Discharge Diagnoses: Principal Problem:   MDD (major depressive disorder), single episode, severe ([place])   Past Psychiatric History:   Past Medical History:  Past Medical History:  Diagnosis Date  . ADHD (attention deficit hyperactivity disorder)   . Fracture of finger of left hand 06/2014   left 3rd finger PI fx.  . History of asthma    no current med.    Past Surgical History:   Procedure Laterality Date  . NO PAST SURGERIES    . PERCUTANEOUS PINNING Left 06/09/2014   Procedure: PERCUTANEOUS PINNING EXTREMITY, LEFT 3RD FINGER;  Surgeon: Marianna Payment, MD;  Location: [place];  Service: Orthopedics;  Laterality: Left;   Family History: History reviewed. No pertinent family history. Family Psychiatric  History:  Social History:  Social History   Substance and Sexual Activity  Alcohol Use No     Social History   Substance and Sexual Activity  Drug Use No    Social History   Socioeconomic History  . Marital status: Single    Spouse name: Not on file  . Number of children: Not on file  . Years of education: Not on file  . Highest education level: Not on file  Occupational History  . Not on file  Social Needs  . Financial resource strain: Not on file  . Food insecurity    Worry: Not on file    Inability: Not on file  . Transportation needs    Medical: Not on file    Non-medical: Not on file  Tobacco Use  . Smoking status: Current Every Day Smoker    Types: Cigarettes  . Smokeless tobacco: Never Used  Substance and Sexual Activity  . Alcohol use: No  . Drug use: No  . Sexual activity: Not on file  Lifestyle  . Physical activity    Days per week: Not on file  Minutes per session: Not on file  . Stress: Not on file  Relationships  . Social Musicianconnections    Talks on phone: Not on file    Gets together: Not on file    Attends religious service: Not on file    Active member of club or organization: Not on file    Attends meetings of clubs or organizations: Not on file    Relationship status: Not on file  Other Topics Concern  . Not on file  Social History Narrative  . Not on file    Hospital Course:  Daisy Lazaroah Whittley was admitted for MDD (major depressive disorder), single episode, severe ([facility]) and crisis management.  Pt was treated discharged with the medications listed below under Medication List.  Medical problems were  identified and treated as needed.  Home medications were restarted as appropriate.  Improvement was monitored by observation and Daisy LazarNoah Lukic 's daily report of symptom reduction.  Emotional and mental status was monitored by daily self-inventory reports completed by Daisy LazarNoah Wirthlin and clinical staff.         Daisy Lazaroah Ambers was evaluated by the treatment team for stability and plans for continued recovery upon discharge. Daisy Lazaroah Suh 's motivation was an integral factor for scheduling further treatment. Employment, transportation, bed availability, health status, family support, and any pending legal issues were also considered during hospital stay. Pt was offered further treatment options upon discharge including but not limited to Residential, Intensive Outpatient, and Outpatient treatment.  Daisy Lazaroah Cupples will follow up with the services as listed below under Follow Up Information.     Upon completion of this admission the patient was both mentally and medically stable for discharge denying suicidal/homicidal ideation, auditory or visual hallucinations.   Daisy LazarNoah Sampedro responded well to treatment with trazodone 50mg  and daily group sessions without adverse effects. Pt demonstrated improvement without reported or observed adverse effects to the point of stability appropriate for outpatient management. Pertinent labs include: CMP and CBC , for which outpatient follow-up is necessary for lab recheck as mentioned below. Reviewed CBC, CMP, BAL, and UDS + for THC  all unremarkable aside from noted exceptions.   Physical Findings:  AIMS: Facial and Oral Movements Muscles of Facial Expression: None, normal Lips and Perioral Area: None, normal Jaw: None, normal Tongue: None, normal,Extremity Movements Upper (arms, wrists, hands, fingers): None, normal Lower (legs, knees, ankles, toes): None, normal, Trunk Movements Neck, shoulders, hips: None, normal, Overall Severity Severity of abnormal movements (highest  score from questions above): None, normal Incapacitation due to abnormal movements: None, normal Patient's awareness of abnormal movements (rate only patient's report): No Awareness, Dental Status Current problems with teeth and/or dentures?: No Does patient usually wear dentures?: No  CIWA:    COWS:     Musculoskeletal: Strength & Muscle Tone: within normal limits Gait & Station: normal Patient leans: N/A  Psychiatric Specialty Exam: See SRA by MD  Physical Exam  Nursing note and vitals reviewed. Constitutional: He appears well-developed.  Neurological: He is alert.  Psychiatric: He has a normal mood and affect. His behavior is normal.    Review of Systems  Psychiatric/Behavioral: Positive for depression.  All other systems reviewed and are negative.   Blood pressure 125/74, pulse (!) 57, temperature (!) 97.3 F (36.3 C), temperature source Oral, resp. rate 16, height 5\' 9"  (1.753 m), weight 57.6 kg.Body mass index is 18.75 kg/m.     Has this patient used any form of tobacco in the last 30 days? (Cigarettes, Smokeless Tobacco,  Cigars, and/or Pipes) Yes, No  Blood Alcohol level:  Lab Results  Component Value Date   ETH 279 (H) 03/17/2019   ETH 246 (H) 07/21/2014    Metabolic Disorder Labs:  No results found for: HGBA1C, MPG No results found for: PROLACTIN No results found for: CHOL, TRIG, HDL, CHOLHDL, VLDL, LDLCALC  See Psychiatric Specialty Exam and Suicide Risk Assessment completed by Attending Physician prior to discharge.  Discharge destination:  Home  Is patient on multiple antipsychotic therapies at discharge:  No   Has Patient had three or more failed trials of antipsychotic monotherapy by history:  No  Recommended Plan for Multiple Antipsychotic Therapies: NA  Discharge Instructions    Diet - low sodium heart healthy   Complete by: As directed    Discharge instructions   Complete by: As directed    Take all medications as prescribed. Keep all  follow-up appointments as scheduled.  Do not consume alcohol or use illegal drugs while on prescription medications. Report any adverse effects from your medications to your primary care provider promptly.  In the event of recurrent symptoms or worsening symptoms, call 911, a crisis hotline, or go to the nearest emergency department for evaluation.   Increase activity slowly   Complete by: As directed      Allergies as of 03/20/2019      Reactions   Crab [shellfish Allergy] Swelling   SWELLING OF LIPS Crab and Lobster   Oak Bark [quercus Robur]       Medication List    STOP taking these medications   acetaminophen 325 MG tablet Commonly known as: TYLENOL     TAKE these medications     Indication  traZODone 50 MG tablet Commonly known as: DESYREL Take 1 tablet (50 mg total) by mouth at bedtime as needed for sleep.  Indication: Trouble Sleeping      Follow-up Information    Declines Follow-up Follow up.   Why: Patient declines medication management and/or therapy follow-up referrals.  He has been given information on Alcoholics Anonymous and is interested.  He is encouraged to go to 90 meetings in 90 days after discharge.          Follow-up recommendations:  Activity:  as tolerated Diet:  heart healthy  Comments:  Take all medications as prescribed. Keep all follow-up appointments as scheduled.  Do not consume alcohol or use illegal drugs while on prescription medications. Report any adverse effects from your medications to your primary care provider promptly.  In the event of recurrent symptoms or worsening symptoms, call 911, a crisis hotline, or go to the nearest emergency department for evaluation.   Signed: Oneta Rack, NP 03/20/2019, 9:54 AM   Patient seen, Suicide Assessment Completed.  Disposition Plan Reviewed

## 2020-04-05 ENCOUNTER — Other Ambulatory Visit: Payer: Self-pay

## 2020-04-05 ENCOUNTER — Encounter (HOSPITAL_COMMUNITY): Payer: Self-pay | Admitting: Emergency Medicine

## 2020-04-05 ENCOUNTER — Emergency Department (HOSPITAL_COMMUNITY)
Admission: EM | Admit: 2020-04-05 | Discharge: 2020-04-06 | Disposition: A | Discharge status: Home / Self Care | Payer: Medicaid Other | Attending: Emergency Medicine | Admitting: Emergency Medicine

## 2020-04-05 DIAGNOSIS — J45909 Unspecified asthma, uncomplicated: Secondary | ICD-10-CM | POA: Diagnosis not present

## 2020-04-05 DIAGNOSIS — F1094 Alcohol use, unspecified with alcohol-induced mood disorder: Secondary | ICD-10-CM | POA: Diagnosis present

## 2020-04-05 DIAGNOSIS — F17210 Nicotine dependence, cigarettes, uncomplicated: Secondary | ICD-10-CM | POA: Insufficient documentation

## 2020-04-05 DIAGNOSIS — Z20822 Contact with and (suspected) exposure to covid-19: Secondary | ICD-10-CM | POA: Diagnosis not present

## 2020-04-05 DIAGNOSIS — R45851 Suicidal ideations: Secondary | ICD-10-CM | POA: Insufficient documentation

## 2020-04-05 DIAGNOSIS — F909 Attention-deficit hyperactivity disorder, unspecified type: Secondary | ICD-10-CM | POA: Diagnosis not present

## 2020-04-05 DIAGNOSIS — S61411A Laceration without foreign body of right hand, initial encounter: Secondary | ICD-10-CM

## 2020-04-05 DIAGNOSIS — Z23 Encounter for immunization: Secondary | ICD-10-CM | POA: Diagnosis not present

## 2020-04-05 DIAGNOSIS — Z046 Encounter for general psychiatric examination, requested by authority: Secondary | ICD-10-CM | POA: Insufficient documentation

## 2020-04-05 LAB — CBC
HCT: 45.1 % (ref 39.0–52.0)
Hemoglobin: 15 g/dL (ref 13.0–17.0)
MCH: 30.1 pg (ref 26.0–34.0)
MCHC: 33.3 g/dL (ref 30.0–36.0)
MCV: 90.4 fL (ref 80.0–100.0)
Platelets: 231 10*3/uL (ref 150–400)
RBC: 4.99 MIL/uL (ref 4.22–5.81)
RDW: 13.3 % (ref 11.5–15.5)
WBC: 8 10*3/uL (ref 4.0–10.5)
nRBC: 0 % (ref 0.0–0.2)

## 2020-04-05 LAB — COMPREHENSIVE METABOLIC PANEL
ALT: 17 U/L (ref 0–44)
AST: 19 U/L (ref 15–41)
Albumin: 4.9 g/dL (ref 3.5–5.0)
Alkaline Phosphatase: 65 U/L (ref 38–126)
Anion gap: 12 (ref 5–15)
BUN: 9 mg/dL (ref 6–20)
CO2: 26 mmol/L (ref 22–32)
Calcium: 8.9 mg/dL (ref 8.9–10.3)
Chloride: 107 mmol/L (ref 98–111)
Creatinine, Ser: 0.96 mg/dL (ref 0.61–1.24)
GFR, Estimated: >60 mL/min (ref >60)
Glucose, Bld: 101 mg/dL — ABNORMAL HIGH (ref 70–99)
Potassium: 4.1 mmol/L (ref 3.5–5.1)
Sodium: 145 mmol/L (ref 135–145)
Total Bilirubin: 0.4 mg/dL (ref 0.3–1.2)
Total Protein: 8.2 g/dL — ABNORMAL HIGH (ref 6.5–8.1)

## 2020-04-05 LAB — RAPID URINE DRUG SCREEN, HOSP PERFORMED
Amphetamines: NOT DETECTED
Barbiturates: NOT DETECTED
Benzodiazepines: NOT DETECTED
Cocaine: NOT DETECTED
Opiates: NOT DETECTED
Tetrahydrocannabinol: POSITIVE — AB

## 2020-04-05 LAB — RESP PANEL BY RT-PCR (FLU A&B, COVID) ARPGX2
Influenza A by PCR: NEGATIVE
Influenza B by PCR: NEGATIVE
SARS Coronavirus 2 by RT PCR: NEGATIVE

## 2020-04-05 LAB — ACETAMINOPHEN LEVEL: Acetaminophen (Tylenol), Serum: <10 ug/mL — ABNORMAL LOW (ref 10–30)

## 2020-04-05 LAB — ETHANOL: Alcohol, Ethyl (B): 274 mg/dL — ABNORMAL HIGH (ref <10)

## 2020-04-05 LAB — SALICYLATE LEVEL: Salicylate Lvl: <7 mg/dL — ABNORMAL LOW (ref 7.0–30.0)

## 2020-04-05 MED ORDER — STERILE WATER FOR INJECTION IJ SOLN
INTRAMUSCULAR | Status: AC
  Administered 2020-04-05: 1.2 mL
  Filled 2020-04-05: qty 10

## 2020-04-05 MED ORDER — ZIPRASIDONE MESYLATE 20 MG IM SOLR
20.0000 mg | Freq: Once | INTRAMUSCULAR | Status: AC
  Administered 2020-04-05: 20 mg via INTRAMUSCULAR
  Filled 2020-04-05: qty 20

## 2020-04-05 NOTE — ED Notes (Signed)
Mother called to check on pt.

## 2020-04-05 NOTE — ED Provider Notes (Signed)
[facility]-EMERGENCY DEPT [facility] Emergency Department Provider Note MRN:  [id number]  Arrival date & time: 04/05/20     Chief Complaint   IVC and Suicidal   History of Present Illness   Charles Henry is a 24 y.o. year-old male with no pertinent past medical history presenting to the ED with chief complaint of IVC, suicide.  Patient had a break-up with his girlfriend and is now expressing suicidal ideation, he is intoxicated.  He has a self-inflicted wound on his right hand.  He is belligerent, screaming at police.  I was unable to obtain an accurate HPI, PMH, or ROS due to the patient's intoxication.  Level 5 caveat.  Review of Systems  Positive for suicidal ideation, intoxication, laceration.  Patient's Health History    Past Medical History:  Diagnosis Date  . ADHD (attention deficit hyperactivity disorder)   . Fracture of finger of left hand 06/2014   left 3rd finger PI fx.  . History of asthma    no current med.    Past Surgical History:  Procedure Laterality Date  . NO PAST SURGERIES    . PERCUTANEOUS PINNING Left 06/09/2014   Procedure: PERCUTANEOUS PINNING EXTREMITY, LEFT 3RD FINGER;  Surgeon: Cheral Almas, MD;  Location: [facility];  Service: Orthopedics;  Laterality: Left;    No family history on file.  Social History   Socioeconomic History  . Marital status: Single    Spouse name: Not on file  . Number of children: Not on file  . Years of education: Not on file  . Highest education level: Not on file  Occupational History  . Not on file  Tobacco Use  . Smoking status: Current Every Day Smoker    Types: Cigarettes  . Smokeless tobacco: Never Used  Substance and Sexual Activity  . Alcohol use: No  . Drug use: No  . Sexual activity: Not on file  Other Topics Concern  . Not on file  Social History Narrative  . Not on file   Social Determinants of Health   Financial Resource Strain:   . Difficulty of Paying Living Expenses: Not  on file  Food Insecurity:   . Worried About Programme researcher, broadcasting/film/video in the Last Year: Not on file  . Ran Out of Food in the Last Year: Not on file  Transportation Needs:   . Lack of Transportation (Medical): Not on file  . Lack of Transportation (Non-Medical): Not on file  Physical Activity:   . Days of Exercise per Week: Not on file  . Minutes of Exercise per Session: Not on file  Stress:   . Feeling of Stress : Not on file  Social Connections:   . Frequency of Communication with Friends and Family: Not on file  . Frequency of Social Gatherings with Friends and Family: Not on file  . Attends Religious Services: Not on file  . Active Member of Clubs or Organizations: Not on file  . Attends Banker Meetings: Not on file  . Marital Status: Not on file  Intimate Partner Violence:   . Fear of Current or Ex-Partner: Not on file  . Emotionally Abused: Not on file  . Physically Abused: Not on file  . Sexually Abused: Not on file     Physical Exam   Vitals:   04/05/20 1739 04/05/20 1802  BP: (!) 133/108 124/78  Pulse: 86 96  Resp: 20 18  Temp:  98.8 F (37.1 C)  SpO2: 98% 98%  CONSTITUTIONAL: Well-appearing, screaming and yelling NEURO:  Alert and oriented x 3, no focal deficits EYES:  eyes equal and reactive ENT/NECK:  no LAD, no JVD CARDIO: Regular rate, well-perfused, normal S1 and S2 PULM:  CTAB no wheezing or rhonchi GI/GU:  normal bowel sounds, non-distended, non-tender MSK/SPINE:  No gross deformities, no edema SKIN:  no rash, atraumatic PSYCH: Agitated, angry speech and behavior  *Additional and/or pertinent findings included in MDM below  Diagnostic and Interventional Summary    EKG Interpretation  Date/Time:    Ventricular Rate:    PR Interval:    QRS Duration:   QT Interval:    QTC Calculation:   R Axis:     Text Interpretation:        Labs Reviewed  COMPREHENSIVE METABOLIC PANEL - Abnormal; Notable for the following components:       Result Value   Glucose, Bld 101 (*)    Total Protein 8.2 (*)    All other components within normal limits  ETHANOL - Abnormal; Notable for the following components:   Alcohol, Ethyl (B) 274 (*)    All other components within normal limits  SALICYLATE LEVEL - Abnormal; Notable for the following components:   Salicylate Lvl <7.0 (*)    All other components within normal limits  ACETAMINOPHEN LEVEL - Abnormal; Notable for the following components:   Acetaminophen (Tylenol), Serum <10 (*)    All other components within normal limits  RAPID URINE DRUG SCREEN, HOSP PERFORMED - Abnormal; Notable for the following components:   Tetrahydrocannabinol POSITIVE (*)    All other components within normal limits  RESP PANEL BY RT-PCR (FLU A&B, COVID) ARPGX2  CBC  RAPID HIV SCREEN (HIV 1/2 AB+AG)  HEPATITIS PANEL, ACUTE    No orders to display    Medications  ziprasidone (GEODON) injection 20 mg (20 mg Intramuscular Given 04/05/20 1747)  sterile water (preservative free) injection (1.2 mLs  Given 04/05/20 1748)     Procedures  /  Critical Care .Critical Care Performed by: Sabas Sous, MD Authorized by: Sabas Sous, MD   Critical care provider statement:    Critical care time (minutes):  32   Critical care was necessary to treat or prevent imminent or life-threatening deterioration of the following conditions: Unstable psychiatric condition.   Critical care was time spent personally by me on the following activities:  Discussions with consultants, evaluation of patient's response to treatment, examination of patient, ordering and performing treatments and interventions, ordering and review of laboratory studies, ordering and review of radiographic studies, pulse oximetry, re-evaluation of patient's condition, obtaining history from patient or surrogate and review of old charts    ED Course and Medical Decision Making  I have reviewed the triage vital signs, the nursing notes, and  pertinent available records from the EMR.  Listed above are laboratory and imaging tests that I personally ordered, reviewed, and interpreted and then considered in my medical decision making (see below for details).  IVC, suicidal duration, combative, yelling, not responding to redirection.  Providing Geodon for safety of patient and healthcare providers.     Medically cleared awaiting TTS recommendations.  Signed out to oncoming provider at shift change.  Elmer Sow. Pilar Plate, MD [facility] Emergency Medicine [facility] [email or web address]  Final Clinical Impressions(s) / ED Diagnoses     ICD-10-CM   1. Suicidal thoughts  R45.851     ED Discharge Orders    None       Discharge Instructions  Discussed with and Provided to Patient:   Discharge Instructions   None       Sabas Sous, MD 04/05/20 2258

## 2020-04-05 NOTE — ED Triage Notes (Addendum)
Pt BIB EMS for SI due to breaking up with his girlfriend. ETOH on board, ~8-9 beers. Pt combative and uncooperative on scene. Pt has self-inflicted wound on  knuckle of right index finger from personal knife. Bleeding controlled at this time. Pt is IVC'd.

## 2020-04-06 ENCOUNTER — Emergency Department (HOSPITAL_COMMUNITY): Payer: Medicaid Other

## 2020-04-06 DIAGNOSIS — F1094 Alcohol use, unspecified with alcohol-induced mood disorder: Secondary | ICD-10-CM

## 2020-04-06 LAB — HEPATITIS PANEL, ACUTE
HCV Ab: NONREACTIVE
Hep A IgM: NONREACTIVE
Hep B C IgM: NONREACTIVE
Hepatitis B Surface Ag: NONREACTIVE

## 2020-04-06 LAB — RAPID HIV SCREEN (HIV 1/2 AB+AG)
HIV 1/2 Antibodies: NONREACTIVE
HIV-1 P24 Antigen - HIV24: NONREACTIVE

## 2020-04-06 MED ORDER — CEPHALEXIN 250 MG PO CAPS: 250.0000 mg | ORAL_CAPSULE | Freq: Three times a day (TID) | ORAL | 0 refills | Status: AC

## 2020-04-06 MED ORDER — CEPHALEXIN 250 MG PO CAPS
250.0000 mg | ORAL_CAPSULE | Freq: Three times a day (TID) | ORAL | Status: DC
  Filled 2020-04-06 (×2): qty 1

## 2020-04-06 MED ORDER — TETANUS-DIPHTH-ACELL PERTUSSIS 5-2.5-18.5 LF-MCG/0.5 IM SUSY
0.5000 mL | PREFILLED_SYRINGE | Freq: Once | INTRAMUSCULAR | Status: AC
  Administered 2020-04-06: 0.5 mL via INTRAMUSCULAR
  Filled 2020-04-06: qty 0.5

## 2020-04-06 MED ORDER — CEPHALEXIN 500 MG PO CAPS
500.0000 mg | ORAL_CAPSULE | Freq: Once | ORAL | Status: AC
  Administered 2020-04-06: 500 mg via ORAL
  Filled 2020-04-06: qty 1

## 2020-04-06 NOTE — Discharge Instructions (Signed)
To help you maintain a sober lifestyle, a substance abuse treatment program may be beneficial to you.  Contact [facility] at your earliest opportunity to ask about enrolling in their program:      [facility]      [address]      [place], [place] [address]      [phone]      Ask about their Substance Abuse Intensive Outpatient Program.  New patients are being seen in their walk-in clinic.  Walk-in hours for this program on Thursday mornings.  Walk-in patients are seen on a first come, first served basis, so try to arrive as close to 7:45 am as possible for the best chance of being seen the same day.

## 2020-04-06 NOTE — ED Notes (Addendum)
Pt right hand cleaned and bandaged

## 2020-04-06 NOTE — Consult Note (Signed)
[facility] Psych ED Discharge  04/06/2020 9:47 AM Terin Cragle  MRN:  [id number] Principal Problem: Alcohol-induced mood disorder [facility]) Discharge Diagnoses: Principal Problem:   Alcohol-induced mood disorder (HCC)   Subjective: Patient states "I am ready to go home, I know I do not need to drink anymore."  Patient insightful regarding alcohol use.  Patient reports he acts impulsively when he uses alcohol.  Patient reports readiness to stop using alcohol completely.  Patient reports he was intoxicated last evening and punched a mirror in his bathroom.  Patient denies that this was in an attempt to harm himself.  Reports this was an impulsive act.  Patient resides in [place].  Patient denies access to weapons.  Patient discusses that law enforcement has removed the one weapon that he owned.  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  Patient endorses alcohol use, 1-2 beers daily.  Patient endorses marijuana use, daily.  Patient endorses last use of both alcohol and marijuana was on yesterday.  Patient assessed by nurse practitioner.  Patient alert and oriented, answers appropriately.  Patient denies suicidal ideations, patient denies any history of suicide attempts.  Patient denies homicidal ideations.  Patient denies both auditory and visual hallucinations.  There is no evidence of delusional thought content and no indication that patient is responding to internal stimuli.  Patient denies symptoms of paranoia.  Patient denies any history of mental health diagnoses.  Patient denies any outpatient psychiatric follow-up currently.  Patient agrees with plan to consult peers support as well as follow-up with outpatient psychiatry resources.  Patient offered support and encouragement.  Patient gives verbal consent to speak with his (not biological) mother, Eston Esters.  Phone number [phone].  Spoke with patient's mother who denies concerns for patient safety.  Patient's mother  reports patient will not be able to receive weapon back that was confiscated by police.  Patient's mother reports that she herself is a peer specialist and plans to pick up patient today and begin attending AA meetings with patient.  Patient's mother also reports attempting to get patient into therapy, discussed resources.  Total Time spent with patient: 30 minutes  Past Psychiatric History: MDD, alcohol-induced mood disorder  Past Medical History:  Past Medical History:  Diagnosis Date  . ADHD (attention deficit hyperactivity disorder)   . Fracture of finger of left hand 06/2014   left 3rd finger PI fx.  . History of asthma    no current med.    Past Surgical History:  Procedure Laterality Date  . NO PAST SURGERIES    . PERCUTANEOUS PINNING Left 06/09/2014   Procedure: PERCUTANEOUS PINNING EXTREMITY, LEFT 3RD FINGER;  Surgeon: Cheral Almas, MD;  Location: [facility];  Service: Orthopedics;  Laterality: Left;   Family History: No family history on file. Family Psychiatric  History: None reported Social History:  Social History   Substance and Sexual Activity  Alcohol Use No     Social History   Substance and Sexual Activity  Drug Use No    Social History   Socioeconomic History  . Marital status: Single    Spouse name: Not on file  . Number of children: Not on file  . Years of education: Not on file  . Highest education level: Not on file  Occupational History  . Not on file  Tobacco Use  . Smoking status: Current Every Day Smoker    Types: Cigarettes  . Smokeless tobacco: Never Used  Substance and Sexual  Activity  . Alcohol use: No  . Drug use: No  . Sexual activity: Not on file  Other Topics Concern  . Not on file  Social History Narrative  . Not on file   Social Determinants of Health   Financial Resource Strain:   . Difficulty of Paying Living Expenses: Not on file  Food Insecurity:   . Worried About Programme researcher, broadcasting/film/video in the Last  Year: Not on file  . Ran Out of Food in the Last Year: Not on file  Transportation Needs:   . Lack of Transportation (Medical): Not on file  . Lack of Transportation (Non-Medical): Not on file  Physical Activity:   . Days of Exercise per Week: Not on file  . Minutes of Exercise per Session: Not on file  Stress:   . Feeling of Stress : Not on file  Social Connections:   . Frequency of Communication with Friends and Family: Not on file  . Frequency of Social Gatherings with Friends and Family: Not on file  . Attends Religious Services: Not on file  . Active Member of Clubs or Organizations: Not on file  . Attends Banker Meetings: Not on file  . Marital Status: Not on file    Has this patient used any form of tobacco in the last 30 days? (Cigarettes, Smokeless Tobacco, Cigars, and/or Pipes) A prescription for an FDA-approved tobacco cessation medication was offered at discharge and the patient refused  Current Medications: Current Facility-Administered Medications  Medication Dose Route Frequency Provider Last Rate Last Admin  . cephALEXin (KEFLEX) capsule 250 mg  250 mg Oral Q8H Cardama, Amadeo Garnet, MD       Current Outpatient Medications  Medication Sig Dispense Refill  . traZODone (DESYREL) 50 MG tablet Take 1 tablet (50 mg total) by mouth at bedtime as needed for sleep. (Patient not taking: Reported on 04/06/2020) 30 tablet 0   PTA Medications: (Not in a hospital admission)   Musculoskeletal: Strength & Muscle Tone: within normal limits Gait & Station: normal Patient leans: N/A  Psychiatric Specialty Exam: Physical Exam Vitals and nursing note reviewed.  Constitutional:      Appearance: He is well-developed.  HENT:     Head: Normocephalic.  Cardiovascular:     Rate and Rhythm: Normal rate.  Pulmonary:     Effort: Pulmonary effort is normal.  Neurological:     Mental Status: He is alert and oriented to person, place, and time.  Psychiatric:         Attention and Perception: Attention and perception normal.        Mood and Affect: Mood and affect normal.        Speech: Speech normal.        Behavior: Behavior normal. Behavior is cooperative.        Thought Content: Thought content normal.        Cognition and Memory: Cognition and memory normal.        Judgment: Judgment normal.     Review of Systems  Constitutional: Negative.   HENT: Negative.   Eyes: Negative.   Respiratory: Negative.   Cardiovascular: Negative.   Gastrointestinal: Negative.   Genitourinary: Negative.   Musculoskeletal: Negative.   Skin: Negative.   Neurological: Negative.   Psychiatric/Behavioral: Negative.     Blood pressure 129/66, pulse 73, temperature 98.4 F (36.9 C), temperature source Oral, resp. rate 16, SpO2 97 %.There is no height or weight on file to calculate BMI.  General Appearance:  Casual and Fairly Groomed  Eye Contact:  Good  Speech:  Clear and Coherent and Normal Rate  Volume:  Normal  Mood:  Euthymic  Affect:  Appropriate and Congruent  Thought Process:  Coherent, Goal Directed and Descriptions of Associations: Intact  Orientation:  Full (Time, Place, and Person)  Thought Content:  WDL and Logical  Suicidal Thoughts:  No  Homicidal Thoughts:  No  Memory:  Immediate;   Good Recent;   Good Remote;   Good  Judgement:  Fair  Insight:  Fair  Psychomotor Activity:  Normal  Concentration:  Concentration: Good and Attention Span: Good  Recall:  Good  Fund of Knowledge:  Good  Language:  Good  Akathisia:  No  Handed:  Right  AIMS (if indicated):     Assets:  Communication Skills Desire for Improvement Financial Resources/Insurance Housing Intimacy Leisure Time Physical Health Resilience Social Support Talents/Skills Transportation  ADL's:  Intact  Cognition:  WNL  Sleep:        Demographic Factors:  Male and Caucasian  Loss Factors: NA  Historical Factors: Impulsivity  Risk Reduction Factors:   Sense of  responsibility to family, Employed, Positive social support, Positive therapeutic relationship and Positive coping skills or problem solving skills  Continued Clinical Symptoms:  Alcohol/Substance Abuse/Dependencies  Cognitive Features That Contribute To Risk:  None    Suicide Risk:  Minimal: No identifiable suicidal ideation.  Patients presenting with no risk factors but with morbid ruminations; may be classified as minimal risk based on the severity of the depressive symptoms    Plan Of Care/Follow-up recommendations:  Other:  Patient reviewed with Dr. Bronwen Betters  Follow-up with outpatient psychiatry resources as well as outpatient substance use resources.  Disposition: Patient cleared by psychiatry Patrcia Dolly, FNP 04/06/2020, 9:47 AM

## 2020-04-06 NOTE — ED Notes (Signed)
Pt speaking with [facility].  

## 2020-04-06 NOTE — ED Notes (Signed)
MD in room with pt

## 2020-04-06 NOTE — ED Notes (Signed)
Gave phone to call ride.

## 2020-04-06 NOTE — BH Assessment (Signed)
[facility] Assessment Progress Note  Pt was given 20mg  Geodon IM at 17:47.  Nurse said patient was still sleeping.  TTS to see pt when he is alert.

## 2020-04-06 NOTE — BH Assessment (Signed)
Tele Assessment Note   Patient Name: Charles Henry MRN: [id number] Referring Physician: Dr. Kennis Carina Location of Patient: Charles Henry Location of Provider: Behavioral Health TTS Department  Charles Henry is an 24 y.o. male.  -Clinician reviewed note by Dr. Pilar Plate.  Patient had a break-up with his girlfriend and is now expressing suicidal ideation, he is intoxicated.  He has a self-inflicted wound on his right hand.  He is belligerent, screaming at police.  Pt says he cannot recall how he ended up at the [facility].  He recalled that EMS had brought him to [facility] but he does not remember the circumstances around them being at his house.  Patient says that he had been drinking yesterday and that he does not recall being belligerent with police.  Someone had called EMS to patient's home.  Pt had cut his knuckle on right hand.  Pt says he fell and cut it.  It is a clean cut however and patient may have self inflicted the cut.  Patient did not want EMS to provide service.  [employer] were called the the scene and patient was placed on IVC by law enforcement.  Pt had to be given 20mg  Geodon at [facility].    Patient says he has no SI, no plan to kill himself.  He denies previous attempts.  Patient also denies any HI or A/V hallucinations.    Patient smokes marijuana (a couple of hits a day) every day.  He says "I should not be drinking ETOH."  He says he cannot handle ETOH. Patient says he usually will drink 1-2 beers a day.    Clinician asked patient if he had a recent break up with a girlfriend.  He says his girlfriend died in October 07, 2018.  Patient did report this in a previous assessment in 03/2019.    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  He has his own place and pays his bills.  He was in foster care for a long time.  When he aged out of foster care, his foster grandparents let him stay with them.  Patient considers them as parents since he does not know his biological parents.  Patient is wanting to leave to  go home.    Pt is alert and oriented x3.  He has good eye contact.  He is not responding to internal stimuli.  His thought process is not delusional.  He thinks clearly and coherently but without much insight.  Pt appears impulsive.  Pt reports sleep and appetite to be WNL.  Pt has no current outpatient services.  He has been at [facility] in 03/2019.  -Clinician reviewed patient with 04/2019, FNP who recommended observation and psychiatry to review IVC.  Clinician informed Dr. Nira Conn of disposition recommendation.  Diagnosis: ADHD; ETOH use; cannabis use d/o  Past Medical History:  Past Medical History:  Diagnosis Date  . ADHD (attention deficit hyperactivity disorder)   . Fracture of finger of left hand 06/2014   left 3rd finger PI fx.  . History of asthma    no current med.    Past Surgical History:  Procedure Laterality Date  . NO PAST SURGERIES    . PERCUTANEOUS PINNING Left 06/09/2014   Procedure: PERCUTANEOUS PINNING EXTREMITY, LEFT 3RD FINGER;  Surgeon: 08/08/2014, MD;  Location: [facility];  Service: Orthopedics;  Laterality: Left;    Family History: No family history on file.  Social History:  reports that he has been smoking cigarettes.  He has never used smokeless tobacco. He reports that he does not drink alcohol and does not use drugs.  Additional Social History:  Alcohol / Drug Use Pain Medications: Noen Prescriptions: None Over the Counter: None History of alcohol / drug use?: Yes Substance #1 Name of Substance 1: Marijuana 1 - Age of First Use: Teens 1 - Amount (size/oz): "One or two hits a day" 1 - Frequency: Daily use 1 - Duration: on going 1 - Last Use / Amount: 12/02 Substance #2 Name of Substance 2: ETOH 2 - Age of First Use: 24 years of age 2 - Amount (size/oz): Varies 2 - Frequency: Daily 2 - Duration: off and on 2 - Last Use / Amount: Last night, 12/02  CIWA: CIWA-Ar BP: 124/78 Pulse Rate: 96 COWS:    Allergies:   Allergies  Allergen Reactions  . Crab [Shellfish Allergy] Swelling    SWELLING OF LIPS  Crab and Lobster  . Oak Bark [Quercus Robur]     Home Medications: (Not in a hospital admission)   OB/GYN Status:  No LMP for male patient.  General Assessment Data Assessment unable to be completed: Yes Reason for Not Completing Assessment: Geodon administered at 17:47 Location of Assessment: [facility] ED TTS Assessment: In system Is this a Tele or Face-to-Face Assessment?: Tele Assessment Is this an Initial Assessment or a Re-assessment for this encounter?: Initial Assessment Patient Accompanied by:: N/A Language Other than English: No Living Arrangements: Other (Comment) (Pt lives by himself.) What gender do you identify as?: Male Date Telepsych consult ordered in [facility]: 04/05/20 Time Telepsych consult ordered in [facility]: 1836 Marital status: Single Pregnancy Status: No Living Arrangements: Alone Can pt return to current living arrangement?: Yes Admission Status: Involuntary Petitioner: ED Attending Is patient capable of signing voluntary admission?: No Referral Source:  (Pt not sure how he ended up at the hospital.) Insurance type: self pay     Crisis Care Plan Living Arrangements: Alone Name of Psychiatrist: None Name of Therapist: None  Education Status Is patient currently in school?: No Is the patient employed, unemployed or receiving disability?: Employed  Risk to self with the past 6 months Suicidal Ideation: No Has patient been a risk to self within the past 6 months prior to admission? : No Suicidal Intent: No Has patient had any suicidal intent within the past 6 months prior to admission? : No Is patient at risk for suicide?: No Suicidal Plan?: No Has patient had any suicidal plan within the past 6 months prior to admission? : No Access to Means: No What has been your use of drugs/alcohol within the last 12 months?: ETOH, THC Previous Attempts/Gestures: No How many  times?: 0 Other Self Harm Risks: ETOH use Triggers for Past Attempts: None known Intentional Self Injurious Behavior: None (Pt says he cut his right knuckle when he fell earlier last n) Family Suicide History: No Recent stressful life event(s): Conflict (Comment) Persecutory voices/beliefs?: No Depression: No Depression Symptoms:  (Denies depressive symptoms) Substance abuse history and/or treatment for substance abuse?: No Suicide prevention information given to non-admitted patients: Not applicable  Risk to Others within the past 6 months Homicidal Ideation: No Does patient have any lifetime risk of violence toward others beyond the six months prior to admission? : No Thoughts of Harm to Others: No Current Homicidal Intent: No Current Homicidal Plan: No Access to Homicidal Means: No Identified Victim: No one History of harm to others?: No Assessment of Violence: None Noted Violent Behavior Description: None reported Does  patient have access to weapons?: No Criminal Charges Pending?: No Does patient have a court date: No Is patient on probation?: No  Psychosis Hallucinations: None noted Delusions: None noted  Mental Status Report Appearance/Hygiene: In scrubs Eye Contact: Good Motor Activity: Freedom of movement, Unremarkable Speech: Logical/coherent Level of Consciousness: Alert Mood: Anxious Affect: Anxious (Wanting to go home) Anxiety Level: Minimal Thought Processes: Coherent, Relevant Judgement: Impaired Orientation: Person, Place, Time Obsessive Compulsive Thoughts/Behaviors: None  Cognitive Functioning Concentration: Normal Memory: Recent Impaired, Remote Intact Is patient IDD: No Insight: Poor Impulse Control: Poor Appetite: Good Have you had any weight changes? : No Change Sleep: No Change Total Hours of Sleep: 8 Vegetative Symptoms: None  ADLScreening [facility] Assessment Services) Patient's cognitive ability adequate to safely complete daily  activities?: Yes Patient able to express need for assistance with ADLs?: Yes Independently performs ADLs?: Yes (appropriate for developmental age)  Prior Inpatient Therapy Prior Inpatient Therapy: Yes Prior Therapy Dates: 03/2019 Prior Therapy Facilty/Provider(s): [facility] Reason for Treatment: Depression  Prior Outpatient Therapy Prior Outpatient Therapy: No Does patient have an ACCT team?: No Does patient have Intensive In-House Services?  : No Does patient have [facility] services? : No Does patient have P4CC services?: No  ADL Screening (condition at time of admission) Patient's cognitive ability adequate to safely complete daily activities?: Yes Is the patient deaf or have difficulty hearing?: No Does the patient have difficulty seeing, even when wearing glasses/contacts?: No (Uses glasses) Does the patient have difficulty concentrating, remembering, or making decisions?: No Patient able to express need for assistance with ADLs?: Yes Does the patient have difficulty dressing or bathing?: No Independently performs ADLs?: Yes (appropriate for developmental age) Does the patient have difficulty walking or climbing stairs?: No Weakness of Legs: None Weakness of Arms/Hands: None  Home Assistive Devices/Equipment Home Assistive Devices/Equipment: None    Abuse/Neglect Assessment (Assessment to be complete while patient is alone) Abuse/Neglect Assessment Can Be Completed: Yes Physical Abuse: Denies Verbal Abuse: Denies Sexual Abuse: Denies Exploitation of patient/patient's resources: Denies Self-Neglect: Denies                Disposition:     This service was provided via telemedicine using a 2-way, interactive audio and video technology.  Names of all persons participating in this telemedicine service and their role in this encounter. Name: Charles Henry Role: patient  Name: Beatriz Stallion, M.S. LCAS QP Role: clinician  Name:  Role:   Name:  Role:     Alexandria Lodge 04/06/2020 5:00 AM

## 2020-04-06 NOTE — ED Notes (Signed)
Pt discharged home. Discharged instructions read to pt who verbalized understanding. All belongings returned to pt. Denies SI/HI, is not delusional and not responding to internal stimuli. Escorted pt to the ED exit.   

## 2020-04-06 NOTE — BH Assessment (Addendum)
[facility] Assessment Progress Note  Per Berneice Heinrich, NP, this pt does not require psychiatric hospitalization at this time.  Pt presents under IVC initiated by law enforcement which has been rescinded by EDP Melene Plan, DO.  Pt is psychiatrically cleared.  Discharge instructions include referral information for CD-IOP at [facility].  Dr Adela Lank and pt's nurse, Diane, have been notified.  Doylene Canning, MA Triage Specialist [phone]

## 2020-04-06 NOTE — ED Notes (Signed)
Pt denies SI or HI. Pt. States he got drunk and fell.

## 2020-04-06 NOTE — ED Provider Notes (Addendum)
I was asked to evaluate the patient.  He presented last night under IVC for suicidal ideations.  During the nurses evaluation he noted a laceration to the right hand.  This was likely sustained last night at an unknown time.  Patient did remember punching walls and mirror.  Likely more than 12 hours since incident.  Laceration is approximately 1 to 1-1/2 cm on the dorsum of the third MCP.  Will update tetanus.  Will obtain a plain film to rule out any foreign bodies.  Will provide prophylactic antibiotics.  Wound was irrigated and bandaged.  Will allow for secondary closure given the likely onset time.  Plain film w/o FB.   [facility] plans for psych reeval in the am.   Nira Conn, MD 04/06/20 [phone]

## 2020-04-06 NOTE — ED Provider Notes (Signed)
Cleared by psych.  D/c home.  Will give script for keflex.    Melene Plan, DO 04/06/20 1259

## 2020-05-14 IMAGING — DX DG CHEST 2V
2 series · 2 of 2 positions shown · non-contrast
Comparison: None.

CLINICAL DATA: Wound to right shoulder

EXAM:
CHEST - 2 VIEW

[chest pa]
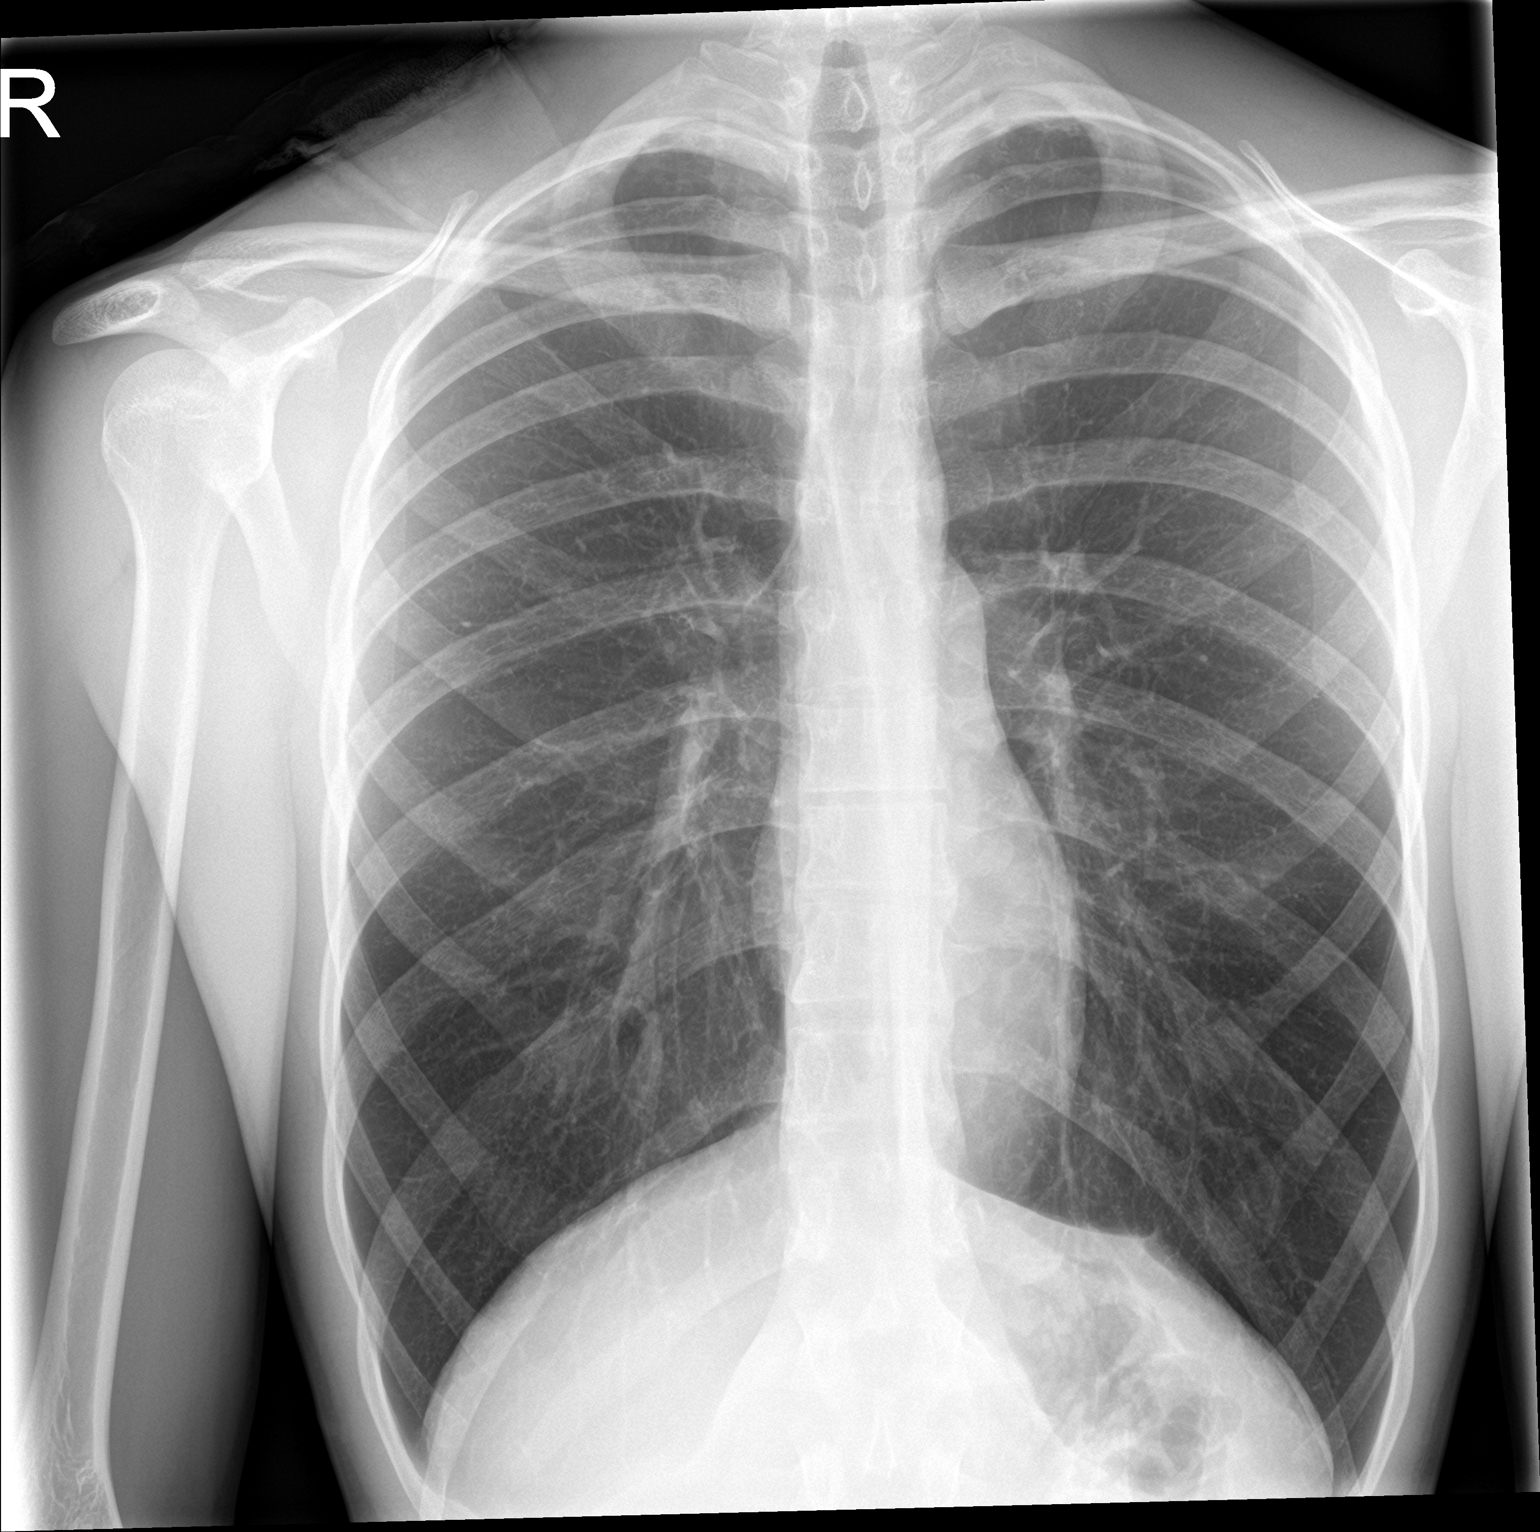

[chest lat]
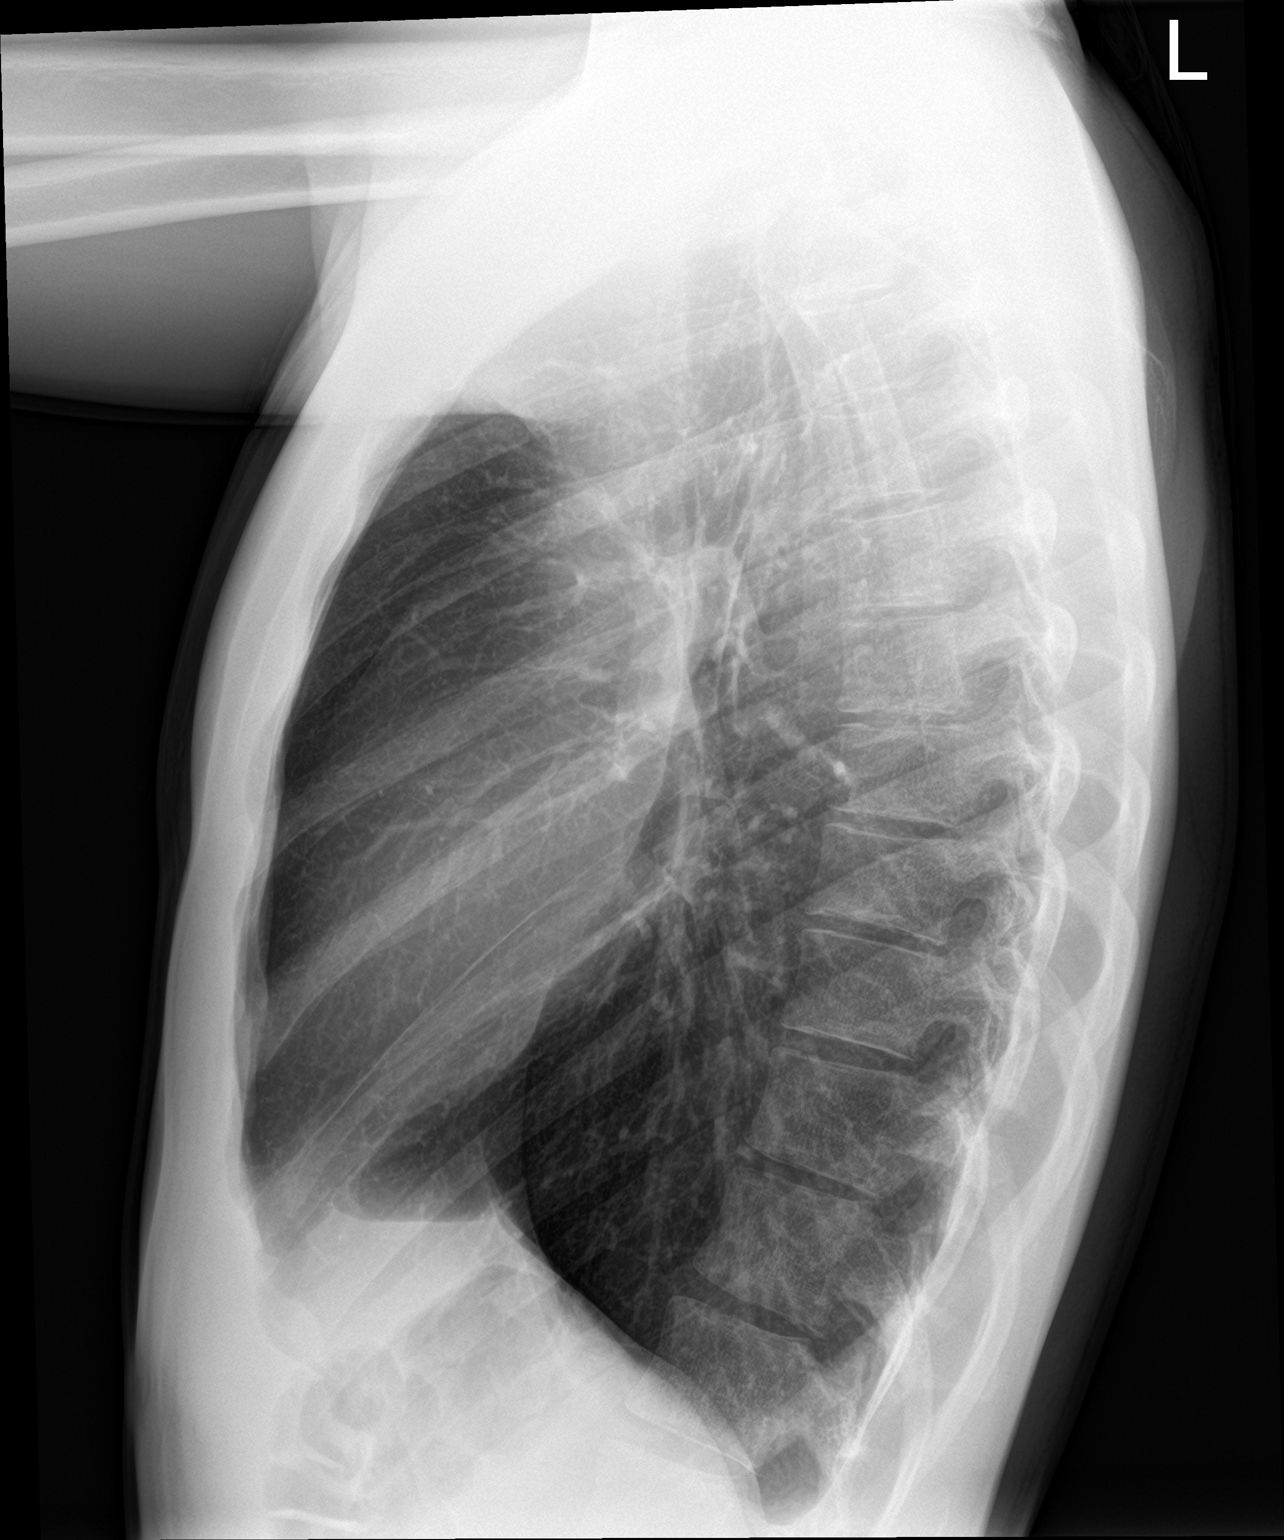

[2 of 2 positions shown; findings below may reference images not displayed]

FINDINGS: There is soft tissue swelling and possible subcutaneous gas in the
right supraclavicular region. This is likely related to the
patient's reported injury. There is no pneumothorax. No large
pleural effusion. The heart size is normal. There is no acute
osseous abnormality.
IMPRESSION: Soft tissue swelling and possible subcutaneous gas in the right
supraclavicular region. This is likely related to the patient's
reported injury.

Otherwise, no acute cardiopulmonary process.

## 2021-04-06 ENCOUNTER — Emergency Department (HOSPITAL_BASED_OUTPATIENT_CLINIC_OR_DEPARTMENT_OTHER): Payer: No Typology Code available for payment source | Admitting: Radiology

## 2021-04-06 ENCOUNTER — Emergency Department (HOSPITAL_BASED_OUTPATIENT_CLINIC_OR_DEPARTMENT_OTHER)
Admission: EM | Admit: 2021-04-06 | Discharge: 2021-04-06 | Disposition: A | Discharge status: Home / Self Care | Payer: No Typology Code available for payment source | Attending: Emergency Medicine | Admitting: Emergency Medicine

## 2021-04-06 ENCOUNTER — Encounter (HOSPITAL_BASED_OUTPATIENT_CLINIC_OR_DEPARTMENT_OTHER): Payer: Self-pay

## 2021-04-06 ENCOUNTER — Other Ambulatory Visit: Payer: Self-pay

## 2021-04-06 DIAGNOSIS — M25511 Pain in right shoulder: Secondary | ICD-10-CM | POA: Insufficient documentation

## 2021-04-06 DIAGNOSIS — J45909 Unspecified asthma, uncomplicated: Secondary | ICD-10-CM | POA: Insufficient documentation

## 2021-04-06 DIAGNOSIS — Y92410 Unspecified street and highway as the place of occurrence of the external cause: Secondary | ICD-10-CM | POA: Diagnosis not present

## 2021-04-06 DIAGNOSIS — G8911 Acute pain due to trauma: Secondary | ICD-10-CM | POA: Diagnosis not present

## 2021-04-06 DIAGNOSIS — M533 Sacrococcygeal disorders, not elsewhere classified: Secondary | ICD-10-CM | POA: Diagnosis present

## 2021-04-06 DIAGNOSIS — F17210 Nicotine dependence, cigarettes, uncomplicated: Secondary | ICD-10-CM | POA: Diagnosis not present

## 2021-04-06 MED ORDER — NAPROXEN 375 MG PO TABS: ORAL_TABLET | ORAL | 0 refills | Status: DC

## 2021-04-06 MED ORDER — NAPROXEN 250 MG PO TABS: 500.0000 mg | ORAL_TABLET | Freq: Once | ORAL | Status: DC

## 2021-04-06 NOTE — ED Notes (Signed)
Pt d/c home per MD order. Discharge summary reviewed, verbalize understanding. Ambulatory. No s/s of acute distress noted.
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
## 2022-11-29 ENCOUNTER — Ambulatory Visit (HOSPITAL_COMMUNITY)
Admission: EM | Admit: 2022-11-29 | Discharge: 2022-11-30 | Disposition: A | Discharge status: Other Acute Inpatient Hospital | Payer: 59 | Attending: Behavioral Health | Admitting: Behavioral Health

## 2022-11-29 ENCOUNTER — Encounter (HOSPITAL_COMMUNITY): Payer: Self-pay | Admitting: Behavioral Health

## 2022-11-29 DIAGNOSIS — F10929 Alcohol use, unspecified with intoxication, unspecified: Secondary | ICD-10-CM | POA: Diagnosis not present

## 2022-11-29 DIAGNOSIS — F1094 Alcohol use, unspecified with alcohol-induced mood disorder: Secondary | ICD-10-CM | POA: Diagnosis present

## 2022-11-29 NOTE — ED Triage Notes (Signed)
Pt presents to [facility] voluntarily by [employer]. Pt reports struggling with depression and alcohol. Pt does report struggling with alcohol addiction. Pt denies SI/HI at this time. Pt denies AVH. Pt reports being sober for 30 days and then decided to drink. Pt is intoxicated. Pt is urgent.
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
## 2022-11-29 NOTE — ED Provider Notes (Incomplete)
Behavioral Health Urgent Care Medical Screening Exam  Patient Name: Charles Henry MRN: [id number] Date of Evaluation: 11/29/22 Chief Complaint:  " Diagnosis:  Final diagnoses:  Alcoholic intoxication with complication (HCC)  Alcohol-induced mood disorder (HCC)   History of Present Illness: Charles Henry is a 27 y.o. male patient with a past psychiatric history of MDD, suicidal ideation, and self-injurious behavior who presented to [facility] voluntarily and unaccompanied via [employer] with complaints of alcohol intoxication and worsening depression.  Patient assessed face-to-face by this provider and chart reviewed on 11/29/22. Charles Henry, [facility] present during assessment. On evaluation, Charles Henry is seated in assessment area in no acute distress. Patient is alert and oriented x4, pleasant. Speech is slurred, at increased volume and at increased volume. Patient will yell out at times, cuss loudly, and is observed banging on doors after assessment. Patient appears heavily intoxicated and smells strongly of alcohol. Eye contact is fleeting. Mood is depressed and labile with congruent affect. Unable to assess thought process and content due to patient's level of acuity. Patient denies suicidal and homicidal ideations but states "I'll punch a motherfucker in his face real quick." Patient denies a history of suicide attempts. Patient reports a history of self-harm and a psychiatric hospitalization "way back when," but is unable to provide details. Per chart review, patient was admitted to [facility] on 03/18/2019 after presenting to [facility] on 03/17/2019 for suicidal thoughts.   During evaluation Charles Henry is ***(position) with no noted distress.  ***He/She is alert/oriented x 4, calm, cooperative, and attentive.  ***His/Her responses were appropriated to assessment questions.  ***His/Her mood is *** with congruent affect.  ***He/She spoke in a clear tone at moderate volume, and normal pace, with good eye contact.    ***He/She denies suicidal/self-harm/homicidal ideation, psychosis, and paranoia.  Objectively:  there is no evidence of psychosis/mania or delusional thinking.  ***He/She conversed coherently, with goal directed thoughts, and no distractibility, or pre-occupation and ***he/she has denied suicidal/self-harm/homicidal ideation, psychosis, and paranoia.     Flowsheet Row ED from 11/29/2022 in [facility] ED from 04/06/2021 in [facility] Emergency Department at [facility] Admission (Discharged[facility]  C-SSRS RISK CATEGORY Low Risk No Risk High Risk       Psychiatric Specialty Exam  Presentation  General Appearance:Disheveled  Eye Contact:Fleeting  Speech:Slurred  Speech Volume:Increased  Handedness:Right   Mood and Affect  Mood: Labile; Depressed  Affect: Labile; Congruent; Depressed   Thought Process  Thought Processes: Other (comment) (UTA due to patient's acuity level)  Descriptions of Associations:-- (UTA due to patient's acuity level)  Orientation:Other (comment) (UTA due to patient's acuity level)  Thought Content:Other (comment) (UTA due to patient's acuity level, patient appears intoxicated)    Hallucinations:None  Ideas of Reference:None  Suicidal Thoughts:No  Homicidal Thoughts:No   Sensorium  Memory: Immediate Fair; Recent Fair; Remote Fair  Judgment: Impaired  Insight: Poor   Executive Functions  Concentration: Poor  Attention Span: Poor  Recall: [employer] of Knowledge: Fair  Language: Fair   Psychomotor Activity  Psychomotor Activity: Other (comment) (Unsteady gait, patient appears intoxicated)   Assets  Assets: Manufacturing systems engineer; Desire for Improvement; Financial Resources/Insurance; Housing; Leisure Time; Physical Health; Resilience   Sleep  Sleep: Poor  Number of hours:  0 (Patine unable to  quantify)   Physical Exam: Physical Exam Vitals and nursing note reviewed.  Constitutional:      General: He is not in acute distress.  Appearance: Normal appearance. He is not ill-appearing.  HENT:     Head: Normocephalic and atraumatic.     Nose: Nose normal.  Eyes:     General:        Right eye: No discharge.        Left eye: No discharge.     Conjunctiva/sclera: Conjunctivae normal.  Cardiovascular:     Rate and Rhythm: Tachycardia present.  Pulmonary:     Effort: Pulmonary effort is normal. No respiratory distress.  Musculoskeletal:        General: Normal range of motion.     Cervical back: Normal range of motion.  Skin:    General: Skin is warm and dry.  Neurological:     General: No focal deficit present.     Mental Status: He is alert and oriented to person, place, and time.  Psychiatric:        Attention and Perception: He is inattentive.    ROS Blood pressure (!) 137/102, pulse (!) 124, temperature 98.4 F (36.9 C), resp. rate 20, SpO2 96%. There is no height or weight on file to calculate BMI.  Musculoskeletal: Strength & Muscle Tone: within normal limits Gait & Station: unsteady Patient leans: Backward   [facility] MSE Discharge Disposition for Follow up and Recommendations: Based on my evaluation the patient appears to have an emergency medical condition for which I recommend the patient be transferred to the emergency department for further evaluation.    Charles Corn, NP 11/29/2022, 11:56 PM

## 2022-11-29 NOTE — Progress Notes (Signed)
   11/29/22 2255  [facility] Triage Screening (Walk-ins at [facility] only)  How Did You Hear About [place]? Legal System  What Is the Reason for Your Visit/Call Today? Pt presents to [facility] voluntarily by [employer]. Pt reports struggling with depression and alcohol. Pt does report struggling with alcohol addiction. Pt denies SI/HI at this time. Pt denies AVH. Pt reports being sober for 30 days and then decided to drink. Pt is intoxicated. Pt is urgent.  How Long Has This Been Causing You Problems? <Week  Have You Recently Had Any Thoughts About Hurting Yourself? No  Are You Planning to Commit Suicide/Harm Yourself At This time? No  Have you Recently Had Thoughts About Hurting Someone Karolee Ohs? No  Are You Planning To Harm Someone At This Time? No  Are you currently experiencing any auditory, visual or other hallucinations? No  Have You Used Any Alcohol or Drugs in the Past 24 Hours? Yes  How long ago did you use Drugs or Alcohol? alcohol tonight  What Did You Use and How Much? Not able to reposnd to how much  Do you have any current medical co-morbidities that require immediate attention? No  Clinician description of patient physical appearance/behavior: Patient is calm and semi cooperative. Patient is cooperative.  What Do You Feel Would Help You the Most Today? Alcohol or Drug Use Treatment;Treatment for Depression or other mood problem  If access to [facility] was not available, would you have sought care in the Emergency Department? No  Determination of Need Urgent (48 hours)  Options For Referral [facility];Facility-Based Crisis

## 2022-11-29 NOTE — ED Notes (Signed)
Report given to [email or web address]  [email or web address]

## 2022-11-30 ENCOUNTER — Emergency Department (HOSPITAL_COMMUNITY): Payer: 59

## 2022-11-30 ENCOUNTER — Encounter (HOSPITAL_COMMUNITY): Payer: Self-pay

## 2022-11-30 ENCOUNTER — Emergency Department (HOSPITAL_COMMUNITY)
Admission: EM | Admit: 2022-11-30 | Discharge: 2022-11-30 | Discharge status: Against Medical Advice | Payer: 59 | Attending: Emergency Medicine | Admitting: Emergency Medicine

## 2022-11-30 DIAGNOSIS — W228XXA Striking against or struck by other objects, initial encounter: Secondary | ICD-10-CM | POA: Diagnosis not present

## 2022-11-30 DIAGNOSIS — S60221A Contusion of right hand, initial encounter: Secondary | ICD-10-CM | POA: Insufficient documentation

## 2022-11-30 DIAGNOSIS — F1094 Alcohol use, unspecified with alcohol-induced mood disorder: Secondary | ICD-10-CM | POA: Diagnosis not present

## 2022-11-30 DIAGNOSIS — R000 Tachycardia, unspecified: Secondary | ICD-10-CM | POA: Insufficient documentation

## 2022-11-30 DIAGNOSIS — S6991XA Unspecified injury of right wrist, hand and finger(s), initial encounter: Secondary | ICD-10-CM | POA: Diagnosis present

## 2022-11-30 DIAGNOSIS — Y908 Blood alcohol level of 240 mg/100 ml or more: Secondary | ICD-10-CM | POA: Insufficient documentation

## 2022-11-30 DIAGNOSIS — F10929 Alcohol use, unspecified with intoxication, unspecified: Secondary | ICD-10-CM

## 2022-11-30 DIAGNOSIS — F1010 Alcohol abuse, uncomplicated: Secondary | ICD-10-CM | POA: Diagnosis not present

## 2022-11-30 LAB — RAPID URINE DRUG SCREEN, HOSP PERFORMED
Amphetamines: NOT DETECTED
Barbiturates: NOT DETECTED
Benzodiazepines: NOT DETECTED
Cocaine: NOT DETECTED
Opiates: NOT DETECTED
Tetrahydrocannabinol: POSITIVE — AB

## 2022-11-30 LAB — COMPREHENSIVE METABOLIC PANEL
ALT: 22 U/L (ref 0–44)
AST: 22 U/L (ref 15–41)
Albumin: 4.5 g/dL (ref 3.5–5.0)
Alkaline Phosphatase: 72 U/L (ref 38–126)
Anion gap: 10 (ref 5–15)
BUN: <5 mg/dL — ABNORMAL LOW (ref 6–20)
CO2: 24 mmol/L (ref 22–32)
Calcium: 8.7 mg/dL — ABNORMAL LOW (ref 8.9–10.3)
Chloride: 106 mmol/L (ref 98–111)
Creatinine, Ser: 1.11 mg/dL (ref 0.61–1.24)
GFR, Estimated: >60 mL/min (ref >60)
Glucose, Bld: 112 mg/dL — ABNORMAL HIGH (ref 70–99)
Potassium: 3.6 mmol/L (ref 3.5–5.1)
Sodium: 140 mmol/L (ref 135–145)
Total Bilirubin: 0.5 mg/dL (ref 0.3–1.2)
Total Protein: 7.4 g/dL (ref 6.5–8.1)

## 2022-11-30 LAB — CBC WITH DIFFERENTIAL/PLATELET
Abs Immature Granulocytes: 0.02 10*3/uL (ref 0.00–0.07)
Basophils Absolute: 0 10*3/uL (ref 0.0–0.1)
Basophils Relative: 1 %
Eosinophils Absolute: 0.2 10*3/uL (ref 0.0–0.5)
Eosinophils Relative: 2 %
HCT: 44.6 % (ref 39.0–52.0)
Hemoglobin: 15 g/dL (ref 13.0–17.0)
Immature Granulocytes: 0 %
Lymphocytes Relative: 26 %
Lymphs Abs: 2.1 10*3/uL (ref 0.7–4.0)
MCH: 29.6 pg (ref 26.0–34.0)
MCHC: 33.6 g/dL (ref 30.0–36.0)
MCV: 88 fL (ref 80.0–100.0)
Monocytes Absolute: 0.4 10*3/uL (ref 0.1–1.0)
Monocytes Relative: 5 %
Neutro Abs: 5.6 10*3/uL (ref 1.7–7.7)
Neutrophils Relative %: 66 %
Platelets: 277 10*3/uL (ref 150–400)
RBC: 5.07 MIL/uL (ref 4.22–5.81)
RDW: 13 % (ref 11.5–15.5)
WBC: 8.4 10*3/uL (ref 4.0–10.5)
nRBC: 0 % (ref 0.0–0.2)

## 2022-11-30 LAB — ETHANOL: Alcohol, Ethyl (B): 300 mg/dL — ABNORMAL HIGH (ref <10)

## 2022-11-30 MED ORDER — NICOTINE POLACRILEX 2 MG MT GUM
2.0000 mg | CHEWING_GUM | Freq: Once | OROMUCOSAL | Status: AC
  Administered 2022-11-30: 2 mg via ORAL
  Filled 2022-11-30: qty 1
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
## 2022-11-30 NOTE — ED Notes (Signed)
Pt notified this RN that he wanted to leave and go home. Pt encouraged pt to stay receive full treatment. Pt insist on leaving so he can go home and sleep. Pt states he will walk home. This RN encouraged pt to stay and wait for the bus at 0500. Pt continues to refuse. Pt seen ambulating out of the facility. Provider made aware.

## 2022-11-30 NOTE — BH Assessment (Signed)
Comprehensive Clinical Assessment (CCA) Note  11/30/2022 Charles Henry [id number]  Disposition: Erskine Emery, NP recommends pt be sent to the ED for medical clearance. Once medically cleared the pt to be admitted to [facility].    The patient demonstrates the following risk factors for suicide: Chronic risk factors for suicide include: substance use disorder. Acute risk factors for suicide include: N/A. Protective factors for this patient include:  Pt denies, SI . Considering these factors, the overall suicide risk at this point appears to be low. Patient is not appropriate for outpatient follow up.  Charles Henry is a 27 year old male who presents voluntary and unaccompanied to [facility]. Clinician asked the pt, "what brought you to the hospital?" Pt reports, he called the police on himself. Pt reports, he wants to understand life. Pt reports, he punched a pole to get his anger out. Pt denies, SI, HI, AVH, self-injurious behaviors and access to weapons.   Pt reports, drinking 15, 24 oz beers (Coors) starting a 0700 (on 11/29/2022). 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 Pt's BAL was 300 at 0229. Pt's UDS is Marijuana. Pt denies, being linked to OPT resources (medication management and/or counseling.) Pt reports, previous inpatient admissions in the past.   Pt presents alert with shirtless, having a swollen pink finger with loud, slurred, profane speech. Clinician also observed a bruise on the pt's arm. During the assessment pt opted to answer certain questions. Pt's mood was depressed, labile. Pt's affect was depressed. Pt's insight was poor. Pt's judgement was impaired. Pt was easily distracted, impulsive and inattentive. After the assessment, clinician observed the pt pacing the halls cursing, yelling. Staff encouraged the pt to return to his assessment. Pt became upset cursing, spilling water; wanting to talk to certain staff (clinician) but still not complying, demanding  a Nicotine patch then he'll comply. During the assessment pt would go back and forth on wanting help for his alcohol use.   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   CCA Screening, Triage and Referral (STR)  Patient Reported Information How did you hear about [place]? Legal System  What Is the Reason for Your Visit/Call Today? Pt presents to [facility] voluntarily by [employer]. Pt reports struggling with depression and alcohol. Pt does report struggling with alcohol addiction. Pt denies SI/HI at this time. Pt denies AVH. Pt reports being sober for 30 days and then decided to drink. Pt is intoxicated. Pt is urgent.  How Long Has This Been Causing You Problems? <Week  What Do You Feel Would Help You the Most Today? Alcohol or Drug Use Treatment; Treatment for Depression or other mood problem   Have You Recently Had Any Thoughts About Hurting Yourself? No  Are You Planning to Commit Suicide/Harm Yourself At This time? No   Flowsheet Row ED from 11/30/2022 in [facility] Emergency Department at [facility] ED from 11/29/2022 in [facility] ED from 04/06/2021 in [facility] Emergency Department at [facility]  C-SSRS RISK CATEGORY Low Risk Low Risk No Risk       Have you Recently Had Thoughts About Hurting Someone Karolee Ohs? No  Are You Planning to Harm Someone at This Time? No  Explanation: Pt denies, HI.   Have You Used Any Alcohol or Drugs in the Past 24 Hours? Yes  What Did You Use and How  Much? Not able to reposnd to how much   Do You Currently Have a Therapist/Psychiatrist? No  Name of Therapist/Psychiatrist:    Have You Been Recently Discharged From Any Office Practice or Programs? No  Explanation of Discharge From Practice/Program: None.     CCA Screening Triage Referral Assessment Type of Contact:  Face-to-Face  Telemedicine Service Delivery: NA Is this Initial or Reassessment? Initial assessment. Date Telepsych consult ordered in [facility]:    Time Telepsych consult ordered in [facility]:    Location of Assessment: [facility]  Provider Location: [facility] Assessment Services   Collateral Involvement: None.   Does Patient Have a Automotive engineer Guardian? No  Legal Guardian Contact Information: Pt is his own guardian.  Copy of Legal Guardianship Form: -- (Pt is his own guardian.)  Legal Guardian Notified of Arrival: -- (Pt is his own guardian.)  Legal Guardian Notified of Pending Discharge: -- (Pt is his own guardian.)  If Minor and Not Living with Parent(s), Who has Custody? Pt is an adult and his own guardian.  Is CPS involved or ever been involved? Never  Is APS involved or ever been involved? Never   Patient Determined To Be At Risk for Harm To Self or Others Based on Review of Patient Reported Information or Presenting Complaint? -- (Pt denies, SI/HI.)  Method: No Plan (Pt denies, SI/HI.)  Availability of Means: No access or NA (Pt denies, SI/HI.)  Intent: Vague intent or NA (Pt denies, SI/HI.)  Notification Required: No need or identified person (Pt denies, SI/HI.)  Additional Information for Danger to Others Potential: -- (Pt denies, SI/HI.)  Additional Comments for Danger to Others Potential: Pt reports, hes mad and can fight.  Are There Guns or Other Weapons in Your Home? No  Types of Guns/Weapons: Pt denies, access to weapons.  Are These Weapons Safely Secured?                            -- (Pt denies, access to weapons.)  Who Could Verify You Are Able To Have These Secured: Pt denies, access to weapons.  Do You Have any Outstanding Charges, Pending Court Dates, Parole/Probation? Pt reports, he has an ongoing DWI case, his last court date was 10/2022 but it was continued.  Contacted To Inform of Risk of Harm To Self or Others: Other: Comment  (Pt denies, SI/HI.)    Does Patient Present under Involuntary Commitment? No    [place] of Residence: [place]   Patient Currently Receiving the Following Services: Not Receiving Services   Determination of Need: Urgent (48 hours)   Options For Referral: [facility]; Facility-Based Crisis   CCA Biopsychosocial Patient Reported Schizophrenia/Schizoaffective Diagnosis in Past: No   Strengths: Pt wants help.   Mental Health Symptoms Depression:   Difficulty Concentrating; Fatigue; Hopelessness; Worthlessness; Increase/decrease in appetite; Sleep (too much or little); Irritability   Duration of Depressive symptoms:  Duration of Depressive Symptoms: Greater than two weeks   Mania:   None   Anxiety:    Worrying; Irritability; Fatigue; Difficulty concentrating; Restlessness   Psychosis:   None   Duration of Psychotic symptoms:    Trauma:   Hypervigilance (Flashbacks.)   Obsessions:   None   Compulsions:   None   Inattention:   Forgetful; Loses things; Disorganized   Hyperactivity/Impulsivity:   Fidgets with hands/feet; Feeling of restlessness   Oppositional/Defiant Behaviors:   Angry; Argumentative; Temper   Emotional Irregularity:  Intense/inappropriate anger   Other Mood/Personality Symptoms:   Depression.    Mental Status Exam Appearance and self-care  Stature:   Average   Weight:   Average weight   Clothing:   -- (Pt is shirtless with a bruised, swollen pinky finger.)   Grooming:   -- (Pt is shirtless.)   Cosmetic use:   None   Posture/gait:   Normal   Motor activity:   Agitated   Sensorium  Attention:   Distractible; Inattentive   Concentration:   Anxiety interferes; Focuses on irrelevancies   Orientation:   Place; Person; Situation   Recall/memory:   Defective in Immediate   Affect and Mood  Affect:   Depressed   Mood:   Depressed   Relating  Eye contact:   Fleeting   Facial expression:    Responsive   Attitude toward examiner:   Cooperative   Thought and Language  Speech flow:  Slurred; Profane; Loud   Thought content:   Appropriate to Mood and Circumstances   Preoccupation:   None   Hallucinations:   None   Organization:   Circumstantial   Company secretary of Knowledge:   Poor   Intelligence:   Average   Abstraction:   Functional   Judgement:   Poor   Reality Testing:   Distorted   Insight:   Poor   Decision Making:   Impulsive   Social Functioning  Social Maturity:   Impulsive   Social Judgement:   "Street Smart"   Stress  Stressors:   Other (Comment) (Worrying about his little girl.)   Coping Ability:   Overwhelmed; Deficient supports   Skill Deficits:   Communication; Decision making; Responsibility   Supports:   Support needed     Religion: Religion/Spirituality Are You A Religious Person?:  (Pt did not answer the question.) How Might This Affect Treatment?: Pt did not answer the question.  Leisure/Recreation: Leisure / Recreation Do You Have Hobbies?: Yes Leisure and Hobbies: Fish.  Exercise/Diet: Exercise/Diet Do You Exercise?:  (Clinician asked the question, pt flexed his muscles and started laughing.) Do You Follow a Special Diet?: No Do You Have Any Trouble Sleeping?: Yes Explanation of Sleeping Difficulties: Pt reports, not sleeping.   CCA Employment/Education Employment/Work Situation: Employment / Work Situation Employment Situation: Employed Work Stressors: UTA due to patient's acuity level Patient's Job has Been Impacted by Current Illness: No Has Patient ever Been in Equities trader?: No  Education: Education Is Patient Currently Attending School?: No Last Grade Completed: 12 Did You Attend College?: Yes What Type of College Degree Do you Have?: [employer] for [employer]. Did You Have An Individualized Education Program (IIEP):  (UTA, pt is intoxicated.) Did You Have Any Difficulty At  School?:  (UTA, pt is intoxicated.) Patient's Education Has Been Impacted by Current Illness:  (UTA, pt is intoxicated.)   CCA Family/Childhood History Family and Relationship History: Family history Marital status: Single Does patient have children?: Yes How many children?: 1 How is patient's relationship with their children?: Per pt his daughter will be one next month. Pt reports, he sees his daughter 4-5 times per month.  Childhood History:  Childhood History By whom was/is the patient raised?:  (Pt reports, he doesn't have parents.) Did patient suffer any verbal/emotional/physical/sexual abuse as a child?:  (Pt did not disclose.) Did patient suffer from severe childhood neglect?:  (Pt did not disclose.) Has patient ever been sexually abused/assaulted/raped as an adolescent or adult?:  (Pt did not disclose.) Witnessed domestic  violence?:  (Pt did not disclose.) Has patient been affected by domestic violence as an adult?:  (Pt did not disclose.)       CCA Substance Use Alcohol/Drug Use: Alcohol / Drug Use Pain Medications: See MAR Prescriptions: See MAR Over the Counter: See MAR History of alcohol / drug use?: Yes Longest period of sobriety (when/how long): UTA, pt is intoxicated. Negative Consequences of Use: Legal, Personal relationships Withdrawal Symptoms: Irritability Substance #1 Name of Substance 1: Alcohol. 1 - Age of First Use: Per pt, since 2020. 1 - Amount (size/oz): Pt reports, drinking 15, 24oz beers (Coors). 1 - Frequency: Daily. 1 - Duration: Ongoing. 1 - Last Use / Amount: 11/29/2022. 1 - Method of Aquiring: Purchase. 1- Route of Use: Oral.    ASAM's:  Six Dimensions of Multidimensional Assessment  Dimension 1:  Acute Intoxication and/or Withdrawal Potential:   Dimension 1:  Description of individual's past and current experiences of substance use and withdrawal: Pt is irritable, very intoxicated.  Dimension 2:  Biomedical Conditions and  Complications:   Dimension 2:  Description of patient's biomedical conditions and  complications: Pt is intoxicated.  Dimension 3:  Emotional, Behavioral, or Cognitive Conditions and Complications:  Dimension 3:  Description of emotional, behavioral, or cognitive conditions and complications: Depression, pt punched a wall. Pt's right hand is brusied and  and little bloody. Pt has a bruise on his right arm.  Dimension 4:  Readiness to Change:  Dimension 4:  Description of Readiness to Change criteria: Pt reports, wanting to stop drinking for his daughter.  Dimension 5:  Relapse, Continued use, or Continued Problem Potential:  Dimension 5:  Relapse, continued use, or continued problem potential critiera description: Pt has continued use since 2020.  Dimension 6:  Recovery/Living Environment:  Dimension 6:  Recovery/Iiving environment criteria description: Pt reports, living alone with no supports.  ASAM Severity Score: ASAM's Severity Rating Score: 9  ASAM Recommended Level of Treatment: ASAM Recommended Level of Treatment: Level II Partial Hospitalization Treatment   Substance use Disorder (SUD) Substance Use Disorder (SUD)  Checklist Symptoms of Substance Use: Continued use despite having a persistent/recurrent physical/psychological problem caused/exacerbated by use, Continued use despite persistent or recurrent social, interpersonal problems, caused or exacerbated by use, Evidence of tolerance  Recommendations for Services/Supports/Treatments: Recommendations for Services/Supports/Treatments Recommendations For Services/Supports/Treatments: Facility Based Crisis  Discharge Disposition: Discharge Disposition Medical Exam completed: Yes  DSM5 Diagnoses: Patient Active Problem List   Diagnosis Date Noted   Alcohol intoxication (HCC) 11/29/2022   Alcohol-induced mood disorder (HCC)    Self-inflicted injury    MDD (major depressive disorder), single episode, severe (HCC) 03/18/2019      Referrals to Alternative Service(s): Referred to Alternative Service(s):   Place:   Date:   Time:    Referred to Alternative Service(s):   Place:   Date:   Time:    Referred to Alternative Service(s):   Place:   Date:   Time:    Referred to Alternative Service(s):   Place:   Date:   Time:     Redmond Pulling, [facility] Comprehensive Clinical Assessment (CCA) Screening, Triage and Referral Note  11/30/2022 Charles Henry [id number]  Chief Complaint:  Chief Complaint  Patient presents with   Depression   Alcohol Intoxication   Stress   Visit Diagnosis:   Patient Reported Information How did you hear about [place]? Legal System  What Is the Reason for Your Visit/Call Today? Pt presents to [facility] voluntarily by [employer]. Pt reports struggling with  depression and alcohol. Pt does report struggling with alcohol addiction. Pt denies SI/HI at this time. Pt denies AVH. Pt reports being sober for 30 days and then decided to drink. Pt is intoxicated. Pt is urgent.  How Long Has This Been Causing You Problems? <Week  What Do You Feel Would Help You the Most Today? Alcohol or Drug Use Treatment; Treatment for Depression or other mood problem   Have You Recently Had Any Thoughts About Hurting Yourself? No  Are You Planning to Commit Suicide/Harm Yourself At This time? No   Have you Recently Had Thoughts About Hurting Someone Karolee Ohs? No  Are You Planning to Harm Someone at This Time? No  Explanation: Pt denies, HI.   Have You Used Any Alcohol or Drugs in the Past 24 Hours? Yes  How Long Ago Did You Use Drugs or Alcohol? No data recorded What Did You Use and How Much? Not able to reposnd to how much   Do You Currently Have a Therapist/Psychiatrist? No  Name of Therapist/Psychiatrist: No data recorded  Have You Been Recently Discharged From Any Office Practice or Programs? No  Explanation of Discharge From Practice/Program: None.    CCA Screening Triage Referral Assessment Type of  Contact: Face-to-Face  Telemedicine Service Delivery:   Is this Initial or Reassessment?   Date Telepsych consult ordered in [facility]:    Time Telepsych consult ordered in [facility]:    Location of Assessment: [facility] Assessment Services  Provider Location: [facility] Assessment Services    Collateral Involvement: None.   Does Patient Have a Automotive engineer Guardian? No data recorded Name and Contact of Legal Guardian: No data recorded If Minor and Not Living with Parent(s), Who has Custody? Pt is an adult and his own guardian.  Is CPS involved or ever been involved? Never  Is APS involved or ever been involved? Never   Patient Determined To Be At Risk for Harm To Self or Others Based on Review of Patient Reported Information or Presenting Complaint? -- (Pt denies, SI/HI.)  Method: No Plan (Pt denies, SI/HI.)  Availability of Means: No access or NA (Pt denies, SI/HI.)  Intent: Vague intent or NA (Pt denies, SI/HI.)  Notification Required: No need or identified person (Pt denies, SI/HI.)  Additional Information for Danger to Others Potential: -- (Pt denies, SI/HI.)  Additional Comments for Danger to Others Potential: Pt reports, hes mad and can fight.  Are There Guns or Other Weapons in Your Home? No  Types of Guns/Weapons: Pt denies, access to weapons.  Are These Weapons Safely Secured?                            -- (Pt denies, access to weapons.)  Who Could Verify You Are Able To Have These Secured: Pt denies, access to weapons.  Do You Have any Outstanding Charges, Pending Court Dates, Parole/Probation? Pt reports, he has an ongoing DWI case, his last court date was 10/2022 but it was continued.  Contacted To Inform of Risk of Harm To Self or Others: Other: Comment (Pt denies, SI/HI.)   Does Patient Present under Involuntary Commitment? No    [place] of Residence: [place]   Patient Currently Receiving the Following Services: Not Receiving Services   Determination  of Need: Urgent (48 hours)   Options For Referral: [facility] Urgent Care; Facility-Based Crisis   Discharge Disposition:  Discharge Disposition Medical Exam completed: Yes  Redmond Pulling, [facility]  Redmond Pulling, MS, [facility], [facility] Triage Specialist [phone]

## 2022-11-30 NOTE — ED Notes (Signed)
Patient transported to X-ray 

## 2022-11-30 NOTE — ED Provider Notes (Signed)
[facility]EM[facility]PT [facility] Emergency Department Provider Note MRN:  [id number]  Arrival date & time: 12/01/22     Chief Complaint   Hand Injury   History of Present Illness   Charles Henry is a 27 y.o. year-old male presents to the ED with chief complaint of right hand pain.  Was transferred to [facility] from [facility] for medical clearance.  States that he has been drinking a lot and wants to stop.  States that he punched a wooden post and injured his hand.  [facility] requests eval in ED.  History provided by patient.   Review of Systems  Pertinent positive and negative review of systems noted in HPI.    Physical Exam   Vitals:   11/30/22 0051  BP: (!) 139/95  Pulse: (!) 103  Resp: 18  Temp: 98.2 F (36.8 C)  SpO2: 100%    CONSTITUTIONAL:  non toxic-appearing, NAD NEURO:  Alert and oriented x 3, CN 3-12 grossly intact EYES:  eyes equal and reactive ENT/NECK:  Supple, no stridor  CARDIO:  mild tachycardia, regular rhythm, appears well-perfused  PULM:  No respiratory distress,  GI/GU:  non-distended,  MSK/SPINE:  moderate swelling and contusion about the right 4th/5th knuckles SKIN:  no rash, atraumatic   *Additional and/or pertinent findings included in MDM below  Diagnostic and Interventional Summary    EKG Interpretation Date/Time:    Ventricular Rate:    PR Interval:    QRS Duration:    QT Interval:    QTC Calculation:   R Axis:      Text Interpretation:         Labs Reviewed  COMPREHENSIVE METABOLIC PANEL - Abnormal; Notable for the following components:      Result Value   Glucose, Bld 112 (*)    BUN <5 (*)    Calcium 8.7 (*)    All other components within normal limits  ETHANOL - Abnormal; Notable for the following components:   Alcohol, Ethyl (B) 300 (*)    All other components within normal limits  RAPID URINE DRUG SCREEN, HOSP PERFORMED - Abnormal; Notable for the following components:   Tetrahydrocannabinol POSITIVE (*)    All other  components within normal limits  CBC WITH DIFFERENTIAL/PLATELET    DG Hand Complete Right  Final Result      Medications - No data to display   Procedures  /  Critical Care Procedures  ED Course and Medical Decision Making  I have reviewed the triage vital signs, the nursing notes, and pertinent available records from the EMR.  Social Determinants Affecting Complexity of Care: Patient is affected by alcoholism.   ED Course:    Medical Decision Making Amount and/or Complexity of Data Reviewed Labs: ordered.    Details: ETOH elevated at 300 Radiology: ordered and independent interpretation performed.    Details: No fracture seen         Consultants: [facility] NP says that patient can return if he desires.  I offered this to patient and he is agreeable with plan.   Treatment and Plan: Patient eloped prior to transfer back to [facility].    Final Clinical Impressions(s) / ED Diagnoses     ICD-10-CM   1. Alcohol abuse  F10.10     2. Contusion of right hand, initial encounter  W09.811B       ED Discharge Orders     None         Discharge Instructions Discussed with and Provided to Patient:  Discharge Instructions   None      Roxy Horseman, PA-C 12/01/22 1610    Gilda Crease, MD 12/01/22 [phone]

## 2023-01-26 ENCOUNTER — Encounter (HOSPITAL_COMMUNITY): Payer: Self-pay | Admitting: Emergency Medicine

## 2023-01-26 ENCOUNTER — Emergency Department (HOSPITAL_COMMUNITY)
Admission: EM | Admit: 2023-01-26 | Discharge: 2023-01-28 | Disposition: A | Discharge status: Other Acute Inpatient Hospital | Payer: 59 | Attending: Emergency Medicine | Admitting: Emergency Medicine

## 2023-01-26 ENCOUNTER — Emergency Department (HOSPITAL_COMMUNITY): Admission: EM | Admit: 2023-01-26 | Discharge: 2023-01-26 | Discharge status: Against Medical Advice | Payer: 59

## 2023-01-26 DIAGNOSIS — R45851 Suicidal ideations: Secondary | ICD-10-CM | POA: Insufficient documentation

## 2023-01-26 DIAGNOSIS — R718 Other abnormality of red blood cells: Secondary | ICD-10-CM | POA: Insufficient documentation

## 2023-01-26 DIAGNOSIS — R200 Anesthesia of skin: Secondary | ICD-10-CM | POA: Insufficient documentation

## 2023-01-26 DIAGNOSIS — M5412 Radiculopathy, cervical region: Secondary | ICD-10-CM | POA: Insufficient documentation

## 2023-01-26 DIAGNOSIS — J45909 Unspecified asthma, uncomplicated: Secondary | ICD-10-CM | POA: Insufficient documentation

## 2023-01-26 DIAGNOSIS — F10129 Alcohol abuse with intoxication, unspecified: Secondary | ICD-10-CM | POA: Insufficient documentation

## 2023-01-26 DIAGNOSIS — T1491XA Suicide attempt, initial encounter: Secondary | ICD-10-CM

## 2023-01-26 DIAGNOSIS — F17210 Nicotine dependence, cigarettes, uncomplicated: Secondary | ICD-10-CM | POA: Diagnosis not present

## 2023-01-26 DIAGNOSIS — Y908 Blood alcohol level of 240 mg/100 ml or more: Secondary | ICD-10-CM | POA: Diagnosis not present

## 2023-01-26 DIAGNOSIS — F322 Major depressive disorder, single episode, severe without psychotic features: Secondary | ICD-10-CM | POA: Insufficient documentation

## 2023-01-26 DIAGNOSIS — F10929 Alcohol use, unspecified with intoxication, unspecified: Secondary | ICD-10-CM | POA: Diagnosis present

## 2023-01-26 LAB — RAPID URINE DRUG SCREEN, HOSP PERFORMED
Amphetamines: NOT DETECTED
Barbiturates: NOT DETECTED
Benzodiazepines: NOT DETECTED
Cocaine: NOT DETECTED
Opiates: NOT DETECTED
Tetrahydrocannabinol: POSITIVE — AB

## 2023-01-26 LAB — COMPREHENSIVE METABOLIC PANEL
ALT: 22 U/L (ref 0–44)
AST: 22 U/L (ref 15–41)
Albumin: 4.6 g/dL (ref 3.5–5.0)
Alkaline Phosphatase: 91 U/L (ref 38–126)
Anion gap: 14 (ref 5–15)
BUN: 8 mg/dL (ref 6–20)
CO2: 22 mmol/L (ref 22–32)
Calcium: 8.7 mg/dL — ABNORMAL LOW (ref 8.9–10.3)
Chloride: 104 mmol/L (ref 98–111)
Creatinine, Ser: 0.77 mg/dL (ref 0.61–1.24)
GFR, Estimated: >60 mL/min (ref >60)
Glucose, Bld: 98 mg/dL (ref 70–99)
Potassium: 3.6 mmol/L (ref 3.5–5.1)
Sodium: 140 mmol/L (ref 135–145)
Total Bilirubin: 0.7 mg/dL (ref 0.3–1.2)
Total Protein: 8.3 g/dL — ABNORMAL HIGH (ref 6.5–8.1)

## 2023-01-26 LAB — CBC WITH DIFFERENTIAL/PLATELET
Abs Immature Granulocytes: 0.04 10*3/uL (ref 0.00–0.07)
Basophils Absolute: 0 10*3/uL (ref 0.0–0.1)
Basophils Relative: 0 %
Eosinophils Absolute: 0.1 10*3/uL (ref 0.0–0.5)
Eosinophils Relative: 1 %
HCT: 49.1 % (ref 39.0–52.0)
Hemoglobin: 16.5 g/dL (ref 13.0–17.0)
Immature Granulocytes: 0 %
Lymphocytes Relative: 24 %
Lymphs Abs: 2.8 10*3/uL (ref 0.7–4.0)
MCH: 29.8 pg (ref 26.0–34.0)
MCHC: 33.6 g/dL (ref 30.0–36.0)
MCV: 88.8 fL (ref 80.0–100.0)
Monocytes Absolute: 0.6 10*3/uL (ref 0.1–1.0)
Monocytes Relative: 5 %
Neutro Abs: 8.1 10*3/uL — ABNORMAL HIGH (ref 1.7–7.7)
Neutrophils Relative %: 70 %
Platelets: 321 10*3/uL (ref 150–400)
RBC: 5.53 MIL/uL (ref 4.22–5.81)
RDW: 12.9 % (ref 11.5–15.5)
WBC: 11.7 10*3/uL — ABNORMAL HIGH (ref 4.0–10.5)
nRBC: 0 % (ref 0.0–0.2)

## 2023-01-26 LAB — MAGNESIUM: Magnesium: 2.4 mg/dL (ref 1.7–2.4)

## 2023-01-26 LAB — ACETAMINOPHEN LEVEL: Acetaminophen (Tylenol), Serum: <10 ug/mL — ABNORMAL LOW (ref 10–30)

## 2023-01-26 LAB — ETHANOL: Alcohol, Ethyl (B): 303 mg/dL (ref <10)

## 2023-01-26 LAB — SALICYLATE LEVEL: Salicylate Lvl: <7 mg/dL — ABNORMAL LOW (ref 7.0–30.0)

## 2023-01-26 MED ORDER — LORAZEPAM 0.5 MG PO TABS: 0.5000 mg | ORAL_TABLET | ORAL | Status: DC | PRN

## 2023-01-26 MED ORDER — THIAMINE MONONITRATE 100 MG PO TABS
100.0000 mg | ORAL_TABLET | Freq: Every day | ORAL | Status: DC
  Administered 2023-01-27 – 2023-01-28 (×2): 100 mg via ORAL
  Filled 2023-01-26 (×2): qty 1

## 2023-01-26 MED ORDER — ADULT MULTIVITAMIN W/MINERALS CH
1.0000 | ORAL_TABLET | Freq: Every day | ORAL | Status: DC
  Administered 2023-01-27 – 2023-01-28 (×2): 1 via ORAL
  Filled 2023-01-26 (×2): qty 1

## 2023-01-26 MED ORDER — FOLIC ACID 1 MG PO TABS
1.0000 mg | ORAL_TABLET | Freq: Every day | ORAL | Status: DC
  Administered 2023-01-27 – 2023-01-28 (×2): 1 mg via ORAL
  Filled 2023-01-26 (×2): qty 1

## 2023-01-26 MED ORDER — LORAZEPAM 1 MG PO TABS: 1.0000 mg | ORAL_TABLET | ORAL | Status: DC | PRN

## 2023-01-26 MED ORDER — THIAMINE HCL 100 MG/ML IJ SOLN: 100.0000 mg | Freq: Every day | INTRAMUSCULAR | Status: DC

## 2023-01-26 NOTE — ED Notes (Signed)
Pt has been dressed out and belongings have placed in a belongings bag (labeled - one bag) and placed in the cabinet underneath the ice machine. Patient has been wanded by security.

## 2023-01-26 NOTE — ED Notes (Signed)
Called patient x3 for triage, no answer. Checked bathrooms and they all was unoccupied at the moment

## 2023-01-26 NOTE — ED Provider Notes (Incomplete)
  [place] EMERGENCY DEPARTMENT AT [facility] Provider Note   CSN: [id number] Arrival date & time: 01/26/23  2123     History Chief Complaint  Patient presents with  . IVC  . Suicidal    Charles Henry is a 27 y.o. male.  HPI     Home Medications Prior to Admission medications   Medication Sig Start Date End Date Taking? Authorizing Provider  naproxen (NAPROSYN) 375 MG tablet Take 1 tablet twice daily as needed for pain. 04/06/21   Molpus, John, MD      Allergies    Crab [shellfish allergy] and Oak bark [quercus robur]    Review of Systems   Review of Systems  Physical Exam Updated Vital Signs BP (!) 153/103 (BP Location: Right Arm)   Pulse (!) 106   Temp 98.1 F (36.7 C) (Oral)   Resp 16   SpO2 99%  Physical Exam  ED Results / Procedures / Treatments   Labs (all labs ordered are listed, but only abnormal results are displayed) Labs Reviewed  COMPREHENSIVE METABOLIC PANEL  ETHANOL  RAPID URINE DRUG SCREEN, HOSP PERFORMED  CBC WITH DIFFERENTIAL/PLATELET  ACETAMINOPHEN LEVEL  SALICYLATE LEVEL    EKG None  Radiology No results found.  Procedures Procedures   Medications Ordered in ED Medications - No data to display  ED Course/ Medical Decision Making/ A&P    Medical Decision Making Amount and/or Complexity of Data Reviewed Labs: ordered.   27 y.o. male presents to the ER for evaluation of ***. Differential diagnosis includes but is not limited to ***. Vital signs ***. Physical exam as noted above.   I independently reviewed and interpreted the patient's labs. ***.  After consideration of the diagnostic results and the patients response to treatment, I feel that *** .   Emergency department workup does*** not suggest an emergent condition requiring admission or immediate intervention beyond what has been performed at this time.   ***We discussed the results of the labs/imaging. The plan is ***. We discussed strict return  precautions and red flag symptoms. The patient verbalized their understanding and agrees to the plan. The patient is stable and being discharged home in good condition.  Portions of this report may have been transcribed using voice recognition software. Every effort was made to ensure accuracy; however, inadvertent computerized transcription errors may be present.   Final Clinical Impression(s) / ED Diagnoses Final diagnoses:  None    Rx / DC Orders ED Discharge Orders     None
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
## 2023-01-26 NOTE — ED Notes (Signed)
Date and time results received: 01/26/23 2347 (use smartphrase ".now" to insert current time)  Test: etoh Critical Value: 303  Name of Provider Notified: Tonette Lederer, MD  Orders Received? Or Actions Taken?:  n/a

## 2023-01-26 NOTE — ED Triage Notes (Addendum)
Pt arriving via [facility] and in handcuffs for emergency IVC. Pt has been on a drinking binge and today attempted suicide by drinking Awesome cleaning solution. Pt A&O x4 at this time. IVC papers have been filled out.

## 2023-01-26 NOTE — ED Provider Notes (Addendum)
[place] EMERGENCY DEPARTMENT AT [facility] Provider Note   CSN: [id number] Arrival date & time: 01/26/23  2123     History Chief Complaint  Patient presents with   IVC   Suicidal    Charles Henry is a 27 y.o. male with h/o alcohol use disorder presents to the ER for evaluation after self harm attempt. History obtained with nursing staff's conversation with family as well as my conversation with the Behavioral response team and [employer]. The patient does not have any formal psych diagnosis and is not on any medication. He reports that he had ADHD in his youth, but has not taken any medications since high school. He states that he is unable to quantify the amount of beer he drinks, but is likely more than 18 a day. Family reports that he has been on a drinking binge over the last three days. With [employer] present, the patient patient attempted to drink some cleaner.  IVC was taken out by behavioral health team and he was brought to the emergency department.  He reports that he does vape and occasionally smoke but denies any drug use.  HPI     Home Medications Prior to Admission medications   Medication Sig Start Date End Date Taking? Authorizing Provider  naproxen (NAPROSYN) 375 MG tablet Take 1 tablet twice daily as needed for pain. Patient not taking: Reported on 01/27/2023 04/06/21   Molpus, Jonny Ruiz, MD      Allergies    Parke Simmers allergy] and Oak bark [quercus robur]    Review of Systems   Review of Systems  Constitutional:  Negative for chills and fever.  Respiratory:  Negative for shortness of breath.   Cardiovascular:  Negative for chest pain.  Gastrointestinal:  Negative for abdominal pain, constipation, diarrhea, nausea and vomiting.  Neurological:  Positive for numbness. Negative for weakness and headaches.  Psychiatric/Behavioral:  Negative for hallucinations and suicidal ideas.     Physical Exam Updated Vital Signs BP 103/61 (BP Location: Right Arm)   Pulse  83   Temp 98.1 F (36.7 C) (Oral)   Resp 18   SpO2 96%  Physical Exam Vitals and nursing note reviewed.  Constitutional:      Appearance: He is not toxic-appearing.     Comments: Does appear slightly intoxicated. Smells of EtOH. However is conversational.  HENT:     Mouth/Throat:     Mouth: Mucous membranes are moist.  Eyes:     General: No scleral icterus.    Extraocular Movements: Extraocular movements intact.     Pupils: Pupils are equal, round, and reactive to light.  Neck:     Comments: No tenderness diffusely of the neck. Full ROM without issue.  Cardiovascular:     Rate and Rhythm: Normal rate.  Pulmonary:     Effort: Pulmonary effort is normal. No respiratory distress.  Abdominal:     Palpations: Abdomen is soft.     Tenderness: There is no abdominal tenderness. There is no guarding or rebound.  Musculoskeletal:     Cervical back: Normal range of motion. No rigidity or tenderness.  Skin:    General: Skin is warm and dry.  Neurological:     Mental Status: He is alert.     Sensory: Sensory deficit present.     Comments: The patient reports numbness to his ring and pinky finger on the right hand.  Reports numbness in the same distribution over the palm and into the forearm and humerus.  He also  reports some numbness into his shoulder and neck but reports it is also painful sometimes as well but not upon palpation.  He has no overlying skin changes.  He has palpable pulses.  Brisk cap refill present all 5 fingers.  He is able to do finger thumb opposition however does have some weakness with placing his thumb with his ring and pinky finger.  I question the accuracy of this however since he is not pushing his thumb against his ring and index finger and his thumb actually has less strength when comparing it to the ring and pinky finger in comparison to his middle and index finger which the thumb is pressing into the speakers firmly.     ED Results / Procedures / Treatments    Labs (all labs ordered are listed, but only abnormal results are displayed) Labs Reviewed  COMPREHENSIVE METABOLIC PANEL - Abnormal; Notable for the following components:      Result Value   Calcium 8.7 (*)    Total Protein 8.3 (*)    All other components within normal limits  ETHANOL - Abnormal; Notable for the following components:   Alcohol, Ethyl (B) 303 (*)    All other components within normal limits  RAPID URINE DRUG SCREEN, HOSP PERFORMED - Abnormal; Notable for the following components:   Tetrahydrocannabinol POSITIVE (*)    All other components within normal limits  CBC WITH DIFFERENTIAL/PLATELET - Abnormal; Notable for the following components:   WBC 11.7 (*)    Neutro Abs 8.1 (*)    All other components within normal limits  ACETAMINOPHEN LEVEL - Abnormal; Notable for the following components:   Acetaminophen (Tylenol), Serum <10 (*)    All other components within normal limits  SALICYLATE LEVEL - Abnormal; Notable for the following components:   Salicylate Lvl <7.0 (*)    All other components within normal limits  MAGNESIUM    EKG EKG Interpretation Date/Time:  Monday January 26 2023 22:49:51 EDT Ventricular Rate:  115 PR Interval:  138 QRS Duration:  83 QT Interval:  321 QTC Calculation: 444 R Axis:   101  Text Interpretation: Sinus tachycardia Premature atrial complexes Aberrant conduction of SV complex(es) Borderline right axis deviation Borderline T wave abnormalities When compared with ECG of 07/21/2014, HEART RATE has increased Premature atrial complexes are now present T wave abnormality is now present Confirmed by Dione Booze ([id number]) on 01/27/2023 12:31:39 AM  Radiology CT Cervical Spine Wo Contrast  Result Date: 01/27/2023 CLINICAL DATA:  Cervical radiculopathy, right hand numbness. EXAM: CT CERVICAL SPINE WITHOUT CONTRAST TECHNIQUE: Multidetector CT imaging of the cervical spine was performed without intravenous contrast. Multiplanar CT image  reconstructions were also generated. RADIATION DOSE REDUCTION: This exam was performed according to the departmental dose-optimization program which includes automated exposure control, adjustment of the mA and/or kV according to patient size and/or use of iterative reconstruction technique. COMPARISON:  None Available. FINDINGS: Alignment: Normal. Skull base and vertebrae: No acute fracture. No primary bone lesion or focal pathologic process. Soft tissues and spinal canal: No prevertebral fluid or swelling. No visible canal hematoma. Disc levels: Intervertebral disc heights are preserved. Prevertebral soft tissues are not thickened. Spinal canal is widely patent. No significant neuroforaminal narrowing. Upper chest: Negative. Other: None IMPRESSION: 1. Normal examination of the cervical spine. Electronically Signed   By: Helyn Numbers M.D.   On: 01/27/2023 03:15    Procedures Procedures   Medications Ordered in ED Medications  LORazepam (ATIVAN) tablet 1-4 mg (has no administration in  time range)    Or  LORazepam (ATIVAN) tablet 0.5 mg (has no administration in time range)  thiamine (VITAMIN B1) tablet 100 mg (has no administration in time range)    Or  thiamine (VITAMIN B1) injection 100 mg (has no administration in time range)  folic acid (FOLVITE) tablet 1 mg (has no administration in time range)  multivitamin with minerals tablet 1 tablet (has no administration in time range)  nicotine (NICODERM CQ - dosed in mg/24 hours) patch 14 mg (has no administration in time range)    ED Course/ Medical Decision Making/ A&P Clinical Course as of 01/27/23 0614  Mon Jan 26, 2023  2319 On the phone with [employer]. If he can eat and drink without pain then he is clear from poison control. If pain, will need endoscopy within 24 hours [RR]    Clinical Course User Index [RR] Achille Rich, PA-C    Medical Decision Making Amount and/or Complexity of Data Reviewed Labs: ordered. Radiology:  ordered.  Risk OTC drugs. Prescription drug management.   27 y.o. male presents to the ER for evaluation of suicidal attempt. Differential diagnosis includes but is not limited to psych versus substance use. Vital signs initially tachycardia, but has since improved. Physical exam as noted above.   I independently reviewed and interpreted the patient's labs.  Salicylate and acetaminophen levels undetectable.  Ethanol slightly elevated at 303.  CBC does show slight elevation white blood cell count 11.7 with a left shift however no anemia.  CMP shows mildly increased total protein, mildly decreased calcium otherwise no electrolyte or LFT abnormality.  Magnesium within normal limits.  UDS is positive for THC.  He denies any trauma to the area or any falls.  From reading the IVC paperwork, there is concern for possible elopement and the patient's been taken out with IVC forms by the behavioral health team.  Describes the patient attempted to self-harm by drinking cleaner.  Will need to be medically clear for psych evaluation.  EKG Sinus tachycardia Premature atrial complexes Aberrant conduction of SV complex(es) Borderline right axis deviation Borderline T wave abnormalities.  CT cervical spine shows normal examination of the cervical spine.  I discussed this case with my attending. He does not recommend any MRI imaging to be done. Reports it is likely radiculopathy and  recommends outpatient follow up with neurosurgery.  Additionally, I question that some of this may be psychogenic in nature as is the patient was sleeping I did put pressure on his pinky finger and he awoke because of this.  He still is adamant that he does not feel any pressure or sensation into his pinky or ring fingers however he is able to move them and has brisk cap refill present. I do not appreciated any weakness.   I assessed the patient at bedside and he does appear more clinically sober. He was conversation and appropriate. I  watched him eat applesauce without any issue or pain. He has passed the PO challenge. CIWA protocol still in place. Given reassuring labs and imaging, the patient is medically clear for TTS evaluation. IVC completed by behavioral team.   Portions of this report may have been transcribed using voice recognition software. Every effort was made to ensure accuracy; however, inadvertent computerized transcription errors may be present.   I discussed this case with my attending physician who cosigned this note including patient's presenting symptoms, physical exam, and planned diagnostics and interventions. Attending physician stated agreement with plan or made changes to  plan which were implemented.   Final Clinical Impression(s) / ED Diagnoses Final diagnoses:  Suicidal behavior with attempted self-injury [facility])  Cervical radiculopathy    Rx / DC Orders ED Discharge Orders     None         Achille Rich, PA-C 01/27/23 0802    Achille Rich, PA-C 01/27/23 0802    Dione Booze, MD 01/27/23 [phone]

## 2023-01-26 NOTE — ED Notes (Signed)
Pt refusing to let staff get vitals and has attempted to smoke a cigarette. Pt currently being placed back in handcuffs by officer at bedside

## 2023-01-27 ENCOUNTER — Emergency Department (HOSPITAL_COMMUNITY): Payer: 59

## 2023-01-27 DIAGNOSIS — F322 Major depressive disorder, single episode, severe without psychotic features: Secondary | ICD-10-CM | POA: Diagnosis not present

## 2023-01-27 DIAGNOSIS — T1491XA Suicide attempt, initial encounter: Secondary | ICD-10-CM

## 2023-01-27 MED ORDER — NICOTINE 14 MG/24HR TD PT24: 14.0000 mg | MEDICATED_PATCH | Freq: Once | TRANSDERMAL | Status: DC

## 2023-01-27 NOTE — ED Notes (Signed)
pt attempted a phone call twice, no answer. Pt given ginger ale.

## 2023-01-27 NOTE — Consult Note (Signed)
BH ED ASSESSMENT   Reason for Consult:  Psychiatry evaluation Referring Physician:  ER Physician Patient Identification: Charles Henry MRN:  [id number] ED Chief Complaint: Alcohol intoxication (HCC)  Diagnosis:  Principal Problem:   Alcohol intoxication (HCC) Active Problems:   MDD (major depressive disorder), single episode, severe [facility])   ED Assessment Time Calculation: Start Time: 1144 Stop Time: 1210 Total Time in Minutes (Assessment Completion): 26   Subjective:   Charles Henry is a 27 y.o. male patient admitted with Alcohol intoxication and suicide attempt.  Patient was brought in by [employer] in hand cuff and he called them after ingesting "awesome cleaning Solution".  Record review shows previous self inflicted injury.  Previous diagnosis- ADHD, Suicide ideation, Self harm behavior, Alcoholism   HPI:  Patient was seen alert and oriented x 5.  He admitted drinking " a lot of Alcohol" last night but was unable to quantify use. He also admitted that he called the Police by himself but did not mention he drank cleaning solution.  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  Patient denies ever drinking cleaning solution.  He later stated he is under stress-unable to see his one years old daughter.  He also states that he has been having issues with alcoholism which he does drink impulsively when he want to drink.  He recently lost his job with bunch of other at [employer] because they complained about condition of service.  Patient states that he only sees his daughter on Video and also admits that his alcoholism prevents's him from seeing his child.  He was hospitalized at [facility]  in 2020.  Patient was angry and irritable the whole interaction.  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 Patient is 27 years male who was seen for suicide attempt and alcohol intoxications.   He is oriented to his name and place and believed he has been in the ER for several days. Patient is on CIWA protocol using Ativan coverage.  Patient denies alcohol withdrawal seizures.  We will seek inpatient Psychiatry hospitalization for stabilization and safety and  records will be faxed out for bed placement We discussed need to engage in outpatient Alcohol treatment -AA meetings.  We also discussed the dangers associated with drinking Alcohol.  Alcohol level was 303 on arrival.  Past Psychiatric History: ADHD, Suicide ideation, Self harm behavior, Alcoholism.  One hospitalization at [facility] in 2020, Multiple ER Visits for alcohol issues snd suicide ideation.  Risk to Self or Others: Is the patient at risk to self? Yes Has the patient been a risk to self in the past 6 months? Yes Has the patient been a risk to self within the distant past? Yes Is the patient a risk to others? No Has the patient been a risk to others in the past 6 months? No Has the patient been a risk to others within the distant past? No  [place] Scale:  Flowsheet Row ED from 01/26/2023 in [facility] Emergency Department at [facility] ED from 11/30/2022 in [facility] Emergency Department at [facility] ED from 11/29/2022 in [facility]  C-SSRS RISK CATEGORY High Risk Low Risk Low Risk       AIMS:  , , ,  ,   ASAM:    Substance Abuse:     Past  Medical History:  Past Medical History:  Diagnosis Date   ADHD (attention deficit hyperactivity disorder)    Fracture of finger of left hand 06/2014   left 3rd finger PI fx.   History of asthma    no current med.    Past Surgical History:  Procedure Laterality Date   NO PAST SURGERIES     PERCUTANEOUS PINNING Left 06/09/2014   Procedure: PERCUTANEOUS PINNING EXTREMITY, LEFT 3RD FINGER;  Surgeon: Cheral Almas, MD;  Location: [facility];  Service: Orthopedics;  Laterality: Left;   Family History: History  reviewed. No pertinent family history. Family Psychiatric  History: Unknown, adopted. Social History:  Social History   Substance and Sexual Activity  Alcohol Use No     Social History   Substance and Sexual Activity  Drug Use No    Social History   Socioeconomic History   Marital status: Single    Spouse name: Not on file   Number of children: Not on file   Years of education: Not on file   Highest education level: Not on file  Occupational History   Not on file  Tobacco Use   Smoking status: Every Day    Types: Cigarettes   Smokeless tobacco: Never  Substance and Sexual Activity   Alcohol use: No   Drug use: No   Sexual activity: Not on file  Other Topics Concern   Not on file  Social History Narrative   Not on file   Social Determinants of Health   Financial Resource Strain: Not on file  Food Insecurity: No Food Insecurity (05/10/2021)   Received from [facility]   Hunger Vital Sign    Worried About Running Out of Food in the Last Year: Never true    Ran Out of Food in the Last Year: Never true  Transportation Needs: Not on file  Physical Activity: Not on file  Stress: Not on file  Social Connections: Unknown (09/16/2021)   [facility]   Social Network    Social Network: Not on file   Additional Social History:    Allergies:   Allergies  Allergen Reactions   Crab [Shellfish Allergy] Swelling    SWELLING OF LIPS  Crab and Lobster   Oak Bark [Quercus Robur]     Labs:  Results for orders placed or performed during the hospital encounter of 01/26/23 (from the past 48 hour(s))  Comprehensive metabolic panel     Status: Abnormal   Collection Time: 01/26/23 10:45 PM  Result Value Ref Range   Sodium 140 135 - 145 mmol/L   Potassium 3.6 3.5 - 5.1 mmol/L   Chloride 104 98 - 111 mmol/L   CO2 22 22 - 32 mmol/L   Glucose, Bld 98 70 - 99 mg/dL    Comment: Glucose reference range applies only to samples taken after fasting for at least 8  hours.   BUN 8 6 - 20 mg/dL   Creatinine, Ser 1.61 0.61 - 1.24 mg/dL   Calcium 8.7 (L) 8.9 - 10.3 mg/dL   Total Protein 8.3 (H) 6.5 - 8.1 g/dL   Albumin 4.6 3.5 - 5.0 g/dL   AST 22 15 - 41 U/L   ALT 22 0 - 44 U/L   Alkaline Phosphatase 91 38 - 126 U/L   Total Bilirubin 0.7 0.3 - 1.2 mg/dL   GFR, Estimated >09 >60 mL/min    Comment: (NOTE) Calculated using the CKD-EPI Creatinine Equation (2021)    Anion gap  14 5 - 15    Comment: Performed at [facility], [address] [place], [place] [address]  Ethanol     Status: Abnormal   Collection Time: 01/26/23 10:45 PM  Result Value Ref Range   Alcohol, Ethyl (B) 303 (HH) <10 mg/dL    Comment: CRITICAL RESULT CALLED TO, READ BACK BY AND VERIFIED WITH WALLINGTON,K RN @ [phone] 01/26/23 BY CHILDRESS,E (NOTE) Lowest detectable limit for serum alcohol is 10 mg/dL.  For medical purposes only. Performed at [facility], [address], [place], [place] [address]   CBC with Diff     Status: Abnormal   Collection Time: 01/26/23 10:45 PM  Result Value Ref Range   WBC 11.7 (H) 4.0 - 10.5 K/uL   RBC 5.53 4.22 - 5.81 MIL/uL   Hemoglobin 16.5 13.0 - 17.0 g/dL   HCT 41.6 60.6 - 30.1 %   MCV 88.8 80.0 - 100.0 fL   MCH 29.8 26.0 - 34.0 pg   MCHC 33.6 30.0 - 36.0 g/dL   RDW 60.1 09.3 - 23.5 %   Platelets 321 150 - 400 K/uL   nRBC 0.0 0.0 - 0.2 %   Neutrophils Relative % 70 %   Neutro Abs 8.1 (H) 1.7 - 7.7 K/uL   Lymphocytes Relative 24 %   Lymphs Abs 2.8 0.7 - 4.0 K/uL   Monocytes Relative 5 %   Monocytes Absolute 0.6 0.1 - 1.0 K/uL   Eosinophils Relative 1 %   Eosinophils Absolute 0.1 0.0 - 0.5 K/uL   Basophils Relative 0 %   Basophils Absolute 0.0 0.0 - 0.1 K/uL   Immature Granulocytes 0 %   Abs Immature Granulocytes 0.04 0.00 - 0.07 K/uL    Comment: Performed at [facility], [address], [place], [place] [address]  Acetaminophen level     Status: Abnormal   Collection Time:  01/26/23 10:45 PM  Result Value Ref Range   Acetaminophen (Tylenol), Serum <10 (L) 10 - 30 ug/mL    Comment: (NOTE) Therapeutic concentrations vary significantly. A range of 10-30 ug/mL  may be an effective concentration for many patients. However, some  are best treated at concentrations outside of this range. Acetaminophen concentrations >150 ug/mL at 4 hours after ingestion  and >50 ug/mL at 12 hours after ingestion are often associated with  toxic reactions.  Performed at [facility], [address], [place], [place] [address]   Salicylate level     Status: Abnormal   Collection Time: 01/26/23 10:45 PM  Result Value Ref Range   Salicylate Lvl <7.0 (L) 7.0 - 30.0 mg/dL    Comment: Performed at [facility], [address], [place], [place] [address]  Magnesium     Status: None   Collection Time: 01/26/23 10:45 PM  Result Value Ref Range   Magnesium 2.4 1.7 - 2.4 mg/dL    Comment: Performed at [facility], [address], [place], [place] [address]  Urine rapid drug screen (hosp performed)     Status: Abnormal   Collection Time: 01/26/23 11:24 PM  Result Value Ref Range   Opiates NONE DETECTED NONE DETECTED   Cocaine NONE DETECTED NONE DETECTED   Benzodiazepines NONE DETECTED NONE DETECTED   Amphetamines NONE DETECTED NONE DETECTED   Tetrahydrocannabinol POSITIVE (A) NONE DETECTED   Barbiturates NONE DETECTED NONE DETECTED    Comment: (NOTE) DRUG SCREEN FOR MEDICAL PURPOSES ONLY.  IF CONFIRMATION IS NEEDED FOR ANY PURPOSE,  NOTIFY LAB WITHIN 5 DAYS.  LOWEST DETECTABLE LIMITS FOR URINE DRUG SCREEN Drug Class                     Cutoff (ng/mL) Amphetamine and metabolites    1000 Barbiturate and metabolites    200 Benzodiazepine                 200 Opiates and metabolites        300 Cocaine and metabolites        300 THC                            50 Performed at [facility], [address] [place], [place] [address]     Current Facility-Administered Medications  Medication Dose Route Frequency Provider Last Rate Last Admin   folic acid (FOLVITE) tablet 1 mg  1 mg Oral Daily Achille Rich, PA-C   1 mg at 01/27/23 1101   LORazepam (ATIVAN) tablet 1-4 mg  1-4 mg Oral Q1H PRN Achille Rich, PA-C       Or   LORazepam (ATIVAN) tablet 0.5 mg  0.5 mg Oral Q1H PRN Achille Rich, PA-C       multivitamin with minerals tablet 1 tablet  1 tablet Oral Daily Achille Rich, PA-C   1 tablet at 01/27/23 1101   nicotine (NICODERM CQ - dosed in mg/24 hours) patch 14 mg  14 mg Transdermal Once Achille Rich, PA-C       thiamine (VITAMIN B1) tablet 100 mg  100 mg Oral Daily Achille Rich, PA-C   100 mg at 01/27/23 1101   Or   thiamine (VITAMIN B1) injection 100 mg  100 mg Intravenous Daily Achille Rich, PA-C       Current Outpatient Medications  Medication Sig Dispense Refill   naproxen (NAPROSYN) 375 MG tablet Take 1 tablet twice daily as needed for pain. (Patient not taking: Reported on 01/27/2023) 20 tablet 0    Musculoskeletal: Strength & Muscle Tone: within normal limits Gait & Station: normal Patient leans: Front   Psychiatric Specialty Exam: Presentation  General Appearance:  Casual; Neat  Eye Contact: Fleeting; Minimal  Speech: Clear and Coherent; Normal Rate  Speech Volume: Normal  Handedness: Right   Mood and Affect  Mood: Angry; Depressed; Anxious; Irritable  Affect: Congruent; Depressed   Thought Process  Thought Processes: Coherent; Goal Directed  Descriptions of Associations:Intact  Orientation:Full (Time, Place and Person)  Thought Content:Logical  History of Schizophrenia/Schizoaffective disorder:No  Duration of Psychotic Symptoms:No data recorded Hallucinations:Hallucinations: None  Ideas of Reference:None  Suicidal Thoughts:Suicidal Thoughts: No  Homicidal Thoughts:Homicidal Thoughts: No   Sensorium  Memory: Immediate Good;  Recent Good; Remote Good  Judgment: Impaired  Insight: Lacking   Executive Functions  Concentration: Fair  Attention Span: Fair  Recall: Good  Fund of Knowledge: Fair  Language: Good   Psychomotor Activity  Psychomotor Activity: Psychomotor Activity: Normal   Assets  Assets: Communication Skills; Housing; Physical Health    Sleep  Sleep: Sleep: Fair   Physical Exam: Physical Exam Vitals and nursing note reviewed.  Constitutional:      Appearance: Normal appearance.  HENT:     Nose: Nose normal.  Cardiovascular:     Rate and Rhythm: Normal rate and regular rhythm.  Pulmonary:     Effort: Pulmonary effort is normal.  Musculoskeletal:        General: Normal range of motion.  Cervical back: Normal range of motion.  Skin:    General: Skin is dry.  Neurological:     Mental Status: He is alert and oriented to person, place, and time.  Psychiatric:        Attention and Perception: Perception normal. He is inattentive.        Mood and Affect: Mood is anxious and depressed. Affect is angry and tearful.        Speech: Speech normal.        Behavior: Behavior normal. Behavior is cooperative.        Thought Content: Thought content normal.        Cognition and Memory: Cognition and memory normal.        Judgment: Judgment is impulsive.    Review of Systems  Constitutional: Negative.   HENT: Negative.    Eyes: Negative.   Respiratory: Negative.    Cardiovascular: Negative.   Gastrointestinal: Negative.   Genitourinary: Negative.   Musculoskeletal: Negative.   Skin: Negative.   Neurological: Negative.   Endo/Heme/Allergies: Negative.   Psychiatric/Behavioral:  Positive for depression. The patient is nervous/anxious and has insomnia.        Alcoholism   Blood pressure 130/68, pulse 83, temperature 98.4 F (36.9 C), temperature source Oral, resp. rate 18, SpO2 97%. There is no height or weight on file to calculate BMI.  Medical Decision  Making: Patient meets criteria for admission to a Psychiatry inpatient unit for treatment and stabilization.  We will seek bed placement at any facility with available bed.    Patient is placed on CIWA Protocol using Ativan  Problem 1: Alcohol intoxication  Problem 2: Suicide attempt  Problem 3: Recurrent Major Depressive disorder, severe without Psychotic features.  Disposition:  Admit, seek bed placement.  Earney Navy, NP-PMHNP-BC 01/27/2023 12:16 PM

## 2023-01-27 NOTE — BH Assessment (Signed)
Per Everardo Pacific, RN pt refuses to engage in TTS assessment. Clinician expressed to RN the pt will be assessed by provider during day shift.    Redmond Pulling, MS, [facility] Triage Specialist [phone]

## 2023-01-27 NOTE — ED Notes (Signed)
Pt received lunch tray 

## 2023-01-27 NOTE — Progress Notes (Signed)
Pt was accepted to [facility] BMU TODAY 12/27/2022. Bed assignment: 323  Pt meets inpatient criteria per Dahlia Byes, NP  Attending Physician will be Elane Fritz, DO  Report can be called to: [phone]  Pt can arrive after pending discharges  Care Team Notified: Rona Ravens, RN, Dahlia Byes, NP, Horton Marshall, RN, Doyce Para, RN, and Lum Babe, RN   [place], [place]  01/27/2023 12:58 PM

## 2023-01-27 NOTE — ED Notes (Signed)
Received call from Duwayne Heck, RN from [employer].  She cleared patient.

## 2023-01-27 NOTE — ED Notes (Signed)
Pt asked to make a phone call, and was allowed to make phone call.

## 2023-01-27 NOTE — ED Notes (Signed)
Gus Puma Mom would like to be contacted after Shakur speaks w/TTS.   [phone]

## 2023-01-27 NOTE — ED Provider Notes (Signed)
Emergency Medicine Observation Re-evaluation Note  Charles Henry is a 27 y.o. male, seen on rounds today.  Pt initially presented to the ED for complaints of IVC and Suicidal Currently, the patient is laying down in his bed, he is calm, cooperative and talkative.  Physical Exam  BP 130/68 (BP Location: Right Arm)   Pulse 83   Temp 98.4 F (36.9 C) (Oral)   Resp 18   SpO2 97%  Physical Exam General: Alert Cardiac: Regular rate Lungs: Normal effort Psych: Denies SI or HI  ED Course / MDM  EKG:EKG Interpretation Date/Time:  Monday January 26 2023 22:49:51 EDT Ventricular Rate:  115 PR Interval:  138 QRS Duration:  83 QT Interval:  321 QTC Calculation: 444 R Axis:   101  Text Interpretation: Sinus tachycardia Premature atrial complexes Aberrant conduction of SV complex(es) Borderline right axis deviation Borderline T wave abnormalities When compared with ECG of 07/21/2014, HEART RATE has increased Premature atrial complexes are now present T wave abnormality is now present Confirmed by Dione Booze ([id number]) on 01/27/2023 12:31:39 AM  I have reviewed the labs performed to date as well as medications administered while in observation.  Recent changes in the last 24 hours include recommendation for placement at [facility].  Patient tells me that he wants to stop drinking.  He is not showing any signs of withdrawal.  Psychiatry is recommending the patient goes to [facility].  Plan  Current plan is for transfer to [facility].    Anders Simmonds T, DO 01/27/23 1124

## 2023-01-27 NOTE — BH Assessment (Signed)
Clinician messaged Salley Hews, RN: "Hey. It's Trey with TTS. Is the pt able to engage in the assessment, if so the pt will need to be placed in a private room. Can you fax the IVC paperwork to [phone]? Also is the pt medically cleared?"   Clinician awaiting response.    Redmond Pulling, MS, [facility], [facility] Triage Specialist [phone]
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
## 2023-01-28 ENCOUNTER — Encounter: Payer: Self-pay | Admitting: Nurse Practitioner

## 2023-01-28 ENCOUNTER — Inpatient Hospital Stay
Admission: AD | Admit: 2023-01-28 | Discharge: 2023-01-30 | DRG: 885 | Disposition: A | Discharge status: Home / Self Care | Payer: PRIVATE HEALTH INSURANCE | Source: Intra-hospital | Attending: Psychiatry | Admitting: Psychiatry

## 2023-01-28 ENCOUNTER — Other Ambulatory Visit: Payer: Self-pay

## 2023-01-28 DIAGNOSIS — R45851 Suicidal ideations: Secondary | ICD-10-CM | POA: Diagnosis present

## 2023-01-28 DIAGNOSIS — F10229 Alcohol dependence with intoxication, unspecified: Secondary | ICD-10-CM | POA: Diagnosis present

## 2023-01-28 DIAGNOSIS — G4700 Insomnia, unspecified: Secondary | ICD-10-CM | POA: Diagnosis present

## 2023-01-28 DIAGNOSIS — Z79899 Other long term (current) drug therapy: Secondary | ICD-10-CM

## 2023-01-28 DIAGNOSIS — Z602 Problems related to living alone: Secondary | ICD-10-CM | POA: Diagnosis present

## 2023-01-28 DIAGNOSIS — F17210 Nicotine dependence, cigarettes, uncomplicated: Secondary | ICD-10-CM | POA: Diagnosis present

## 2023-01-28 DIAGNOSIS — J45909 Unspecified asthma, uncomplicated: Secondary | ICD-10-CM | POA: Diagnosis present

## 2023-01-28 DIAGNOSIS — Y908 Blood alcohol level of 240 mg/100 ml or more: Secondary | ICD-10-CM | POA: Diagnosis present

## 2023-01-28 DIAGNOSIS — F909 Attention-deficit hyperactivity disorder, unspecified type: Secondary | ICD-10-CM | POA: Diagnosis present

## 2023-01-28 DIAGNOSIS — Z9109 Other allergy status, other than to drugs and biological substances: Secondary | ICD-10-CM | POA: Diagnosis not present

## 2023-01-28 DIAGNOSIS — F322 Major depressive disorder, single episode, severe without psychotic features: Secondary | ICD-10-CM | POA: Diagnosis not present

## 2023-01-28 DIAGNOSIS — F332 Major depressive disorder, recurrent severe without psychotic features: Secondary | ICD-10-CM | POA: Diagnosis present

## 2023-01-28 DIAGNOSIS — F1020 Alcohol dependence, uncomplicated: Principal | ICD-10-CM | POA: Diagnosis present

## 2023-01-28 MED ORDER — THIAMINE HCL 100 MG/ML IJ SOLN: 100.0000 mg | Freq: Every day | INTRAMUSCULAR | Status: DC

## 2023-01-28 MED ORDER — ACETAMINOPHEN 325 MG PO TABS: 650.0000 mg | ORAL_TABLET | Freq: Four times a day (QID) | ORAL | Status: DC | PRN

## 2023-01-28 MED ORDER — LORAZEPAM 2 MG PO TABS: 2.0000 mg | ORAL_TABLET | Freq: Three times a day (TID) | ORAL | Status: DC | PRN

## 2023-01-28 MED ORDER — LORAZEPAM 2 MG/ML IJ SOLN: 2.0000 mg | Freq: Three times a day (TID) | INTRAMUSCULAR | Status: DC | PRN

## 2023-01-28 MED ORDER — THIAMINE MONONITRATE 100 MG PO TABS
100.0000 mg | ORAL_TABLET | Freq: Every day | ORAL | Status: DC
  Administered 2023-01-29 – 2023-01-30 (×2): 100 mg via ORAL
  Filled 2023-01-28 (×2): qty 1

## 2023-01-28 MED ORDER — HALOPERIDOL LACTATE 5 MG/ML IJ SOLN: 5.0000 mg | Freq: Three times a day (TID) | INTRAMUSCULAR | Status: DC | PRN

## 2023-01-28 MED ORDER — ALUM & MAG HYDROXIDE-SIMETH 200-200-20 MG/5ML PO SUSP: 30.0000 mL | ORAL | Status: DC | PRN

## 2023-01-28 MED ORDER — ADULT MULTIVITAMIN W/MINERALS CH
1.0000 | ORAL_TABLET | Freq: Every day | ORAL | Status: DC
  Administered 2023-01-29 – 2023-01-30 (×2): 1 via ORAL
  Filled 2023-01-28 (×2): qty 1

## 2023-01-28 MED ORDER — DIPHENHYDRAMINE HCL 25 MG PO CAPS: 50.0000 mg | ORAL_CAPSULE | Freq: Three times a day (TID) | ORAL | Status: DC | PRN

## 2023-01-28 MED ORDER — LORAZEPAM 1 MG PO TABS
1.0000 mg | ORAL_TABLET | ORAL | Status: AC | PRN
  Administered 2023-01-28: 2 mg via ORAL
  Filled 2023-01-28: qty 2

## 2023-01-28 MED ORDER — LORAZEPAM 0.5 MG PO TABS: 0.5000 mg | ORAL_TABLET | ORAL | Status: AC | PRN

## 2023-01-28 MED ORDER — MAGNESIUM HYDROXIDE 400 MG/5ML PO SUSP: 30.0000 mL | Freq: Every day | ORAL | Status: DC | PRN

## 2023-01-28 MED ORDER — HALOPERIDOL 5 MG PO TABS: 5.0000 mg | ORAL_TABLET | Freq: Three times a day (TID) | ORAL | Status: DC | PRN

## 2023-01-28 MED ORDER — FOLIC ACID 1 MG PO TABS
1.0000 mg | ORAL_TABLET | Freq: Every day | ORAL | Status: DC
  Administered 2023-01-29 – 2023-01-30 (×2): 1 mg via ORAL
  Filled 2023-01-28 (×2): qty 1

## 2023-01-28 MED ORDER — DIPHENHYDRAMINE HCL 50 MG/ML IJ SOLN: 50.0000 mg | Freq: Three times a day (TID) | INTRAMUSCULAR | Status: DC | PRN

## 2023-01-28 MED ORDER — NICOTINE POLACRILEX 2 MG MT GUM
2.0000 mg | CHEWING_GUM | OROMUCOSAL | Status: DC | PRN
  Administered 2023-01-28 – 2023-01-30 (×6): 2 mg via ORAL
  Filled 2023-01-28 (×6): qty 1

## 2023-01-28 NOTE — Progress Notes (Signed)
Admission Note:   Report was received from [place], [place] on a 27 year old male who presents IVC in no acute distress for the treatment of SI and alcohol abuse. According to patient, he is only here to get help with his alcohol addiction. Patient was calm and cooperative with admission process. Patient denies SI/HI/AVH and pain at this time. Patient also denies any signs/symptoms of depression and anxiety. Patient's goal for treatment is "to help me figure out AA and what's going to be the best for me". Patient has no significant past medical history noted in his chart. Skin was assessed with [facility], RN and found to have tattoos to bilateral arms, upper chest, a scar to his upper right arm, and a mole to his lower back. Patient searched and no contraband found and unit policies explained and understanding verbalized. Consents obtained. Food and fluids offered, and fluids accepted. Patient had no additional questions or concerns to voice to this Clinical research associate. Patient remains safe on the unit.

## 2023-01-28 NOTE — Plan of Care (Signed)
Pt denies SI/HI/AVH, compliant with procedures on the unit  Problem: Education: Goal: Knowledge of General Education information will improve Description: Including pain rating scale, medication(s)/side effects and non-pharmacologic comfort measures Outcome: Progressing   Problem: Health Behavior/Discharge Planning: Goal: Ability to manage health-related needs will improve Outcome: Progressing   Problem: Clinical Measurements: Goal: Ability to maintain clinical measurements within normal limits will improve Outcome: Progressing Goal: Will remain free from infection Outcome: Progressing Goal: Diagnostic test results will improve Outcome: Progressing Goal: Respiratory complications will improve Outcome: Progressing Goal: Cardiovascular complication will be avoided Outcome: Progressing   Problem: Activity: Goal: Risk for activity intolerance will decrease Outcome: Progressing   Problem: Nutrition: Goal: Adequate nutrition will be maintained Outcome: Progressing   Problem: Coping: Goal: Level of anxiety will decrease Outcome: Progressing   Problem: Elimination: Goal: Will not experience complications related to bowel motility Outcome: Progressing Goal: Will not experience complications related to urinary retention Outcome: Progressing   Problem: Pain Managment: Goal: General experience of comfort will improve Outcome: Progressing   Problem: Safety: Goal: Ability to remain free from injury will improve Outcome: Progressing   Problem: Skin Integrity: Goal: Risk for impaired skin integrity will decrease Outcome: Progressing   Problem: Education: Goal: Knowledge of [place] General Education information/materials will improve Outcome: Progressing Goal: Emotional status will improve Outcome: Progressing Goal: Mental status will improve Outcome: Progressing Goal: Verbalization of understanding the information provided will improve Outcome: Progressing    Problem: Activity: Goal: Interest or engagement in activities will improve Outcome: Progressing Goal: Sleeping patterns will improve Outcome: Progressing   Problem: Coping: Goal: Ability to verbalize frustrations and anger appropriately will improve Outcome: Progressing Goal: Ability to demonstrate self-control will improve Outcome: Progressing   Problem: Health Behavior/Discharge Planning: Goal: Identification of resources available to assist in meeting health care needs will improve Outcome: Progressing Goal: Compliance with treatment plan for underlying cause of condition will improve Outcome: Progressing   Problem: Physical Regulation: Goal: Ability to maintain clinical measurements within normal limits will improve Outcome: Progressing   Problem: Safety: Goal: Periods of time without injury will increase Outcome: Progressing   Problem: Education: Goal: Knowledge of disease or condition will improve Outcome: Progressing Goal: Understanding of discharge needs will improve Outcome: Progressing   Problem: Physical Regulation: Goal: Complications related to the disease process, condition or treatment will be avoided or minimized Outcome: Progressing   Problem: Safety: Goal: Ability to remain free from injury will improve Outcome: Progressing

## 2023-01-28 NOTE — Tx Team (Signed)
Initial Treatment Plan 01/28/2023 3:54 PM Charles Henry ZOX:[id number]    PATIENT STRESSORS: Substance abuse     PATIENT STRENGTHS: Capable of independent living  Communication skills  General fund of knowledge  Motivation for treatment/growth    PATIENT IDENTIFIED PROBLEMS: Alcohol abuse  SI prior to admission                   DISCHARGE CRITERIA:  Ability to meet basic life and health needs Improved stabilization in mood, thinking, and/or behavior Need for constant or close observation no longer present Reduction of life-threatening or endangering symptoms to within safe limits  PRELIMINARY DISCHARGE PLAN: Outpatient therapy Return to previous living arrangement Return to previous work or school arrangements  PATIENT/FAMILY INVOLVEMENT: This treatment plan has been presented to and reviewed with the patient, Charles Henry. The patient has been given the opportunity to ask questions and make suggestions.  Melani Brisbane, RN 01/28/2023, 3:54 PM

## 2023-01-28 NOTE — ED Notes (Signed)
Pt received breakfast tray 

## 2023-01-28 NOTE — Progress Notes (Signed)
Pt calm and pleasant during assessment denying SI/HI/AVH. Pt also denies anxiety and depression. Pt stated to this writer that he just wants to get help with his alcohol use. Pt given education, support, and encouragement to be active in his treatment plan. Pt didn't have any medications scheduled tonight and hasn't requested anything PRN as of now. Pt being monitored Q 15 minutes for safety per unit protocol, remains safe on the unit

## 2023-01-28 NOTE — ED Notes (Signed)
Pt taking a shower, pt given supplies and chang of clothes for this shower.

## 2023-01-28 NOTE — Progress Notes (Signed)
Pt came up to the nurses station requesting something for sleep and agitation. Check MAR

## 2023-01-28 NOTE — Plan of Care (Signed)
New admission.  Problem: Education: Goal: Knowledge of General Education information will improve Description: Including pain rating scale, medication(s)/side effects and non-pharmacologic comfort measures Outcome: Not Progressing   Problem: Health Behavior/Discharge Planning: Goal: Ability to manage health-related needs will improve Outcome: Not Progressing   Problem: Clinical Measurements: Goal: Ability to maintain clinical measurements within normal limits will improve Outcome: Not Progressing Goal: Will remain free from infection Outcome: Not Progressing Goal: Diagnostic test results will improve Outcome: Not Progressing Goal: Respiratory complications will improve Outcome: Not Progressing Goal: Cardiovascular complication will be avoided Outcome: Not Progressing   Problem: Activity: Goal: Risk for activity intolerance will decrease Outcome: Not Progressing   Problem: Nutrition: Goal: Adequate nutrition will be maintained Outcome: Not Progressing   Problem: Coping: Goal: Level of anxiety will decrease Outcome: Not Progressing   Problem: Elimination: Goal: Will not experience complications related to bowel motility Outcome: Not Progressing Goal: Will not experience complications related to urinary retention Outcome: Not Progressing   Problem: Pain Managment: Goal: General experience of comfort will improve Outcome: Not Progressing   Problem: Safety: Goal: Ability to remain free from injury will improve Outcome: Not Progressing   Problem: Skin Integrity: Goal: Risk for impaired skin integrity will decrease Outcome: Not Progressing   Problem: Education: Goal: Knowledge of [place] General Education information/materials will improve Outcome: Not Progressing Goal: Emotional status will improve Outcome: Not Progressing Goal: Mental status will improve Outcome: Not Progressing Goal: Verbalization of understanding the information provided will  improve Outcome: Not Progressing   Problem: Activity: Goal: Interest or engagement in activities will improve Outcome: Not Progressing Goal: Sleeping patterns will improve Outcome: Not Progressing   Problem: Coping: Goal: Ability to verbalize frustrations and anger appropriately will improve Outcome: Not Progressing Goal: Ability to demonstrate self-control will improve Outcome: Not Progressing   Problem: Health Behavior/Discharge Planning: Goal: Identification of resources available to assist in meeting health care needs will improve Outcome: Not Progressing Goal: Compliance with treatment plan for underlying cause of condition will improve Outcome: Not Progressing   Problem: Physical Regulation: Goal: Ability to maintain clinical measurements within normal limits will improve Outcome: Not Progressing   Problem: Safety: Goal: Periods of time without injury will increase Outcome: Not Progressing   Problem: Education: Goal: Knowledge of disease or condition will improve Outcome: Not Progressing Goal: Understanding of discharge needs will improve Outcome: Not Progressing   Problem: Physical Regulation: Goal: Complications related to the disease process, condition or treatment will be avoided or minimized Outcome: Not Progressing   Problem: Safety: Goal: Ability to remain free from injury will improve Outcome: Not Progressing

## 2023-01-28 NOTE — Group Note (Signed)
Date:  01/28/2023 Time:  4:15 PM  Group Topic/Focus:  Outdoor Recreation/Activity    Participation Level:  Did Not Attend   Lynelle Smoke [facility] 01/28/2023, 4:15 PM

## 2023-01-28 NOTE — ED Provider Notes (Signed)
Emergency Medicine Observation Re-evaluation Note  Charles Henry is a 27 y.o. male, seen on rounds today.  Pt initially presented to the ED for complaints of IVC and Suicidal Currently, the patient is calm and cooperative.  No physical complaints this morning  Physical Exam  BP 118/87 (BP Location: Right Arm)   Pulse 85   Temp 98.5 F (36.9 C) (Oral)   Resp 17   SpO2 98%  Physical Exam General: Well-appearing Cardiac: Normal heart rate Lungs: Normal work of breathing Psych: Calm and cooperative  ED Course / MDM  EKG:EKG Interpretation Date/Time:  Monday January 26 2023 22:49:51 EDT Ventricular Rate:  115 PR Interval:  138 QRS Duration:  83 QT Interval:  321 QTC Calculation: 444 R Axis:   101  Text Interpretation: Sinus tachycardia Premature atrial complexes Aberrant conduction of SV complex(es) Borderline right axis deviation Borderline T wave abnormalities When compared with ECG of 07/21/2014, HEART RATE has increased Premature atrial complexes are now present T wave abnormality is now present Confirmed by Dione Booze ([id number]) on 01/27/2023 12:31:39 AM  I have reviewed the labs performed to date as well as medications administered while in observation.  Recent changes in the last 24 hours include excepted to [facility] BMU by Dr. Marlou Porch  Plan  Current plan is for inpatient treatment at [facility]    Coral Spikes, DO 01/28/23 [id number]

## 2023-01-29 DIAGNOSIS — F332 Major depressive disorder, recurrent severe without psychotic features: Secondary | ICD-10-CM | POA: Diagnosis not present

## 2023-01-29 MED ORDER — NALTREXONE HCL 50 MG PO TABS
50.0000 mg | ORAL_TABLET | Freq: Every day | ORAL | Status: DC
  Administered 2023-01-29 – 2023-01-30 (×2): 50 mg via ORAL
  Filled 2023-01-29 (×2): qty 1

## 2023-01-29 MED ORDER — TRAZODONE HCL 50 MG PO TABS
50.0000 mg | ORAL_TABLET | Freq: Every evening | ORAL | Status: DC | PRN
  Administered 2023-01-29: 50 mg via ORAL
  Filled 2023-01-29: qty 1

## 2023-01-29 NOTE — Progress Notes (Signed)
Pt denies SI HI AVH.  Pt with mild anxiety.  Pt is active in the milieu.  No pain or other complaints voiced. Continued monitoring for safety.       01/29/23 2200  Psych Admission Type (Psych Patients Only)  Admission Status Involuntary  Psychosocial Assessment  Patient Complaints Anxiety  Eye Contact Fair  Facial Expression Animated  Affect Appropriate to circumstance  Speech Logical/coherent  Interaction Assertive  Motor Activity Fidgety  Appearance/Hygiene Unremarkable  Behavior Characteristics Cooperative  Thought Process  Coherency WDL  Content WDL  Delusions WDL  Perception WDL  Hallucination None reported or observed  Judgment WDL  Confusion None  Danger to Self  Current suicidal ideation? Denies  Danger to Others  Danger to Others None reported or observed

## 2023-01-29 NOTE — Group Note (Signed)
Recreation Therapy Group Note   Group Topic:Healthy Support Systems  Group Date: 01/29/2023 Start Time: 1300  End Time: 1355 Facilitators: Rosina Lowenstein, LRT, CTRS Location:  Craft Room  Group Description: Straw Bridge. Individually, patients were given 10 plastic drinking straws and an equal length of masking tape. Using the materials provided, patients were instructed to build a free-standing bridge-like structure to suspend an everyday item (ex: puzzle box) off the floor or table surface. All materials were required to be used in Secondary school teacher. LRT facilitated post-activity discussion reviewing how we, humans, are like the structure we built; when things get too heavy in our life and we do not have adequate supports/coping skills, then we will fall just like the straw-built structure will. LRT focused on how having a "base" or structure on the bottom was necessary for the object to stand, meaning we must be secure and stable first before building on ourselves or others. Patients were encouraged to name 2 healthy supports in their life and reflect on how the skills used in this activity can be generalized to daily life post discharge.   Goal Area(s) Addressed:  Patient will identify two healthy support systems in their life. Patient will work on Product manager. Patient will verbalize the importance of having a strong and steady "base".  Patient will follow multi-step directions. Patients will engage in creativity and use all provided materials.    Affect/Mood: Blunted and Labile   Participation Level: Moderate   Participation Quality: Independent   Behavior: Cooperative   Speech/Thought Process: Coherent   Insight: Fair   Judgement: Fair    Modes of Intervention: Activity   Patient Response to Interventions:  Receptive   Education Outcome:  Acknowledges education   Clinical Observations/Individualized Feedback: Charles Henry was mostly active in their participation  of session activities and group discussion. Pt identified "[name] and my baby girl" as his healthy supports. Pt interacted well with LRT and peers duration of session.  Plan: Continue to engage patient in RT group sessions 2-3x/week.   Rosina Lowenstein, LRT, CTRS 01/29/2023 2:08 PM

## 2023-01-29 NOTE — Group Note (Signed)
[facility] LCSW Group Therapy Note   Group Date: 01/29/2023 Start Time: 1030 End Time: 1130   Type of Therapy/Topic:  Group Therapy:  Emotion Regulation  Participation Level:  Active   Mood:  Description of Group:    The purpose of this group is to assist patients in learning to regulate negative emotions and experience positive emotions. Patients will be guided to discuss ways in which they have been vulnerable to their negative emotions. These vulnerabilities will be juxtaposed with experiences of positive emotions or situations, and patients challenged to use positive emotions to combat negative ones. Special emphasis will be placed on coping with negative emotions in conflict situations, and patients will process healthy conflict resolution skills.  Therapeutic Goals: Patient will identify two positive emotions or experiences to reflect on in order to balance out negative emotions:  Patient will label two or more emotions that they find the most difficult to experience:  Patient will be able to demonstrate positive conflict resolution skills through discussion or role plays:   Summary of Patient Progress: Patient was present in group. Patient was supportive of others.  Patient often required redirection, however was responsive. Patient displayed poor insight.  Patient was somewhat inappropriate in subject matter discussed but when asked to remain on topic he was receptive.    Therapeutic Modalities:   Cognitive Behavioral Therapy Feelings Identification Dialectical Behavioral Therapy   [name], LCSW

## 2023-01-29 NOTE — Group Note (Signed)
Date:  01/29/2023 Time:  9:00 PM  Group Topic/Focus:  Goals Group:   The focus of this group is to help patients establish daily goals to achieve during treatment and discuss how the patient can incorporate goal setting into their daily lives to aide in recovery.    Participation Level:  Active  Participation Quality:  Appropriate  Affect:  Appropriate  Cognitive:  Appropriate  Insight: Appropriate  Engagement in Group:  Engaged  Modes of Intervention:  Socialization  Additional Comments:    Charles Henry Charles Henry 01/29/2023, 9:00 PM

## 2023-01-29 NOTE — [facility] Suicide Risk Assessment (Signed)
[facility] Admission Suicide Risk Assessment   Nursing information obtained from:  Patient Demographic factors:  Male, Caucasian, Living alone Current Mental Status:  NA Loss Factors:  NA Historical Factors:  NA Risk Reduction Factors:  Employed, Sense of responsibility to family  Total Time spent with patient: 1 hour Principal Problem: Major depressive disorder, recurrent severe without psychotic features (HCC) Diagnosis:  Principal Problem:   Major depressive disorder, recurrent severe without psychotic features (HCC) Active Problems:   Alcohol use disorder, severe, dependence (HCC)  Subjective Data: "I had binged for 3 days, drinking, going to sleep and going to get some more.  I told the police I drank the cleaner and I didn't."  Continued Clinical Symptoms:  Alcohol Use Disorder Identification Test Final Score (AUDIT): 8 The "Alcohol Use Disorders Identification Test", Guidelines for Use in Primary Care, Second Edition.  World Science writer [facility]). Score between 0-7:  no or low risk or alcohol related problems. Score between 8-15:  moderate risk of alcohol related problems. Score between 16-19:  high risk of alcohol related problems. Score 20 or above:  warrants further diagnostic evaluation for alcohol dependence and treatment.   CLINICAL FACTORS:   Depression:   Impulsivity   Musculoskeletal: Strength & Muscle Tone: within normal limits Gait & Station: normal Patient leans: N/A  Psychiatric Specialty Exam: Physical Exam Vitals and nursing note reviewed.  Constitutional:      Appearance: Normal appearance.  HENT:     Head: Normocephalic.     Nose: Nose normal.  Pulmonary:     Effort: Pulmonary effort is normal.  Musculoskeletal:        General: Normal range of motion.     Cervical back: Normal range of motion.  Neurological:     General: No focal deficit present.     Mental Status: He is alert and oriented to person, place, and time.     Review of Systems   Psychiatric/Behavioral:  Positive for substance abuse. The patient has insomnia.   All other systems reviewed and are negative.   Blood pressure 113/84, pulse 91, temperature (!) 89.8 F (32.1 C), resp. rate 17, height 5\' 11"  (1.803 m), weight 67.1 kg, SpO2 100%.Body mass index is 20.64 kg/m.  General Appearance: Casual  Eye Contact:  Good  Speech:  Normal Rate  Volume:  Normal  Mood:  Irritable  Affect:  Congruent  Thought Process:  Coherent  Orientation:  Full (Time, Place, and Person)  Thought Content:  WDL and Logical  Suicidal Thoughts:  No  Homicidal Thoughts:  No  Memory:  Immediate;   Good Recent;   Good Remote;   Good  Judgement:  Fair  Insight:  Fair  Psychomotor Activity:  Normal  Concentration:  Concentration: Good and Attention Span: Good  Recall:  Good  Fund of Knowledge:  Good  Language:  Good  Akathisia:  No  Handed:  Right  AIMS (if indicated):     Assets:  Housing Leisure Time Physical Health Resilience Social Support Transportation Vocational/Educational  ADL's:  Intact  Cognition:  WNL  Sleep:         Physical Exam: Physical Exam Vitals and nursing note reviewed.  Constitutional:      Appearance: Normal appearance.  HENT:     Head: Normocephalic.     Nose: Nose normal.  Pulmonary:     Effort: Pulmonary effort is normal.  Musculoskeletal:        General: Normal range of motion.     Cervical back: Normal  range of motion.  Neurological:     General: No focal deficit present.     Mental Status: He is alert and oriented to person, place, and time.    Review of Systems  Psychiatric/Behavioral:  Positive for substance abuse. The patient has insomnia.   All other systems reviewed and are negative.  Blood pressure 113/84, pulse 91, temperature (!) 89.8 F (32.1 C), resp. rate 17, height 5\' 11"  (1.803 m), weight 67.1 kg, SpO2 100%. Body mass index is 20.64 kg/m.   COGNITIVE FEATURES THAT CONTRIBUTE TO RISK:  None    SUICIDE RISK:    Mild:  Suicidal ideation of limited frequency, intensity, duration, and specificity.  There are no identifiable plans, no associated intent, mild dysphoria and related symptoms, good self-control (both objective and subjective assessment), few other risk factors, and identifiable protective factors, including available and accessible social support.  PLAN OF CARE:  Major depressive disorder, recurrent, severe without psychosis: Requests therapy  Alcohol use disorder: Ativan detox protocol started, BAL 303 on 9/23 at 2245  Insomnia: Trazodone 50 mg daily at bedtime PRN  I certify that inpatient services furnished can reasonably be expected to improve the patient's condition.   Nanine Means, NP 01/29/2023, 6:46 AM

## 2023-01-29 NOTE — Group Note (Signed)
Date:  01/29/2023 Time:  5:18 PM  Group Topic/Focus:  Self Care:   The focus of this group is to help patients understand the importance of self-care in order to improve or restore emotional, physical, spiritual, interpersonal, and financial health.    Participation Level:  Active  Participation Quality:  Appropriate  Affect:  Appropriate  Cognitive:  Appropriate  Insight: Appropriate  Engagement in Group:  Engaged  Modes of Intervention:  Activity  Additional Comments:    Biddie Sebek 01/29/2023, 5:18 PM

## 2023-01-29 NOTE — Plan of Care (Signed)
  Problem: Activity: Goal: Risk for activity intolerance will decrease Outcome: Progressing   Problem: Nutrition: Goal: Adequate nutrition will be maintained Outcome: Progressing   Problem: Elimination: Goal: Will not experience complications related to bowel motility Outcome: Progressing Goal: Will not experience complications related to urinary retention Outcome: Progressing   

## 2023-01-29 NOTE — Progress Notes (Addendum)
Patient admitted IVC to BMU on 9.25.24 for alcohol abuse. UDS of 303. "I want to talk with a doctor to get help with AA classes. I want to leave here." Diagnosis of major depressive disorder.  Patient denies SI/HI/AVH. Reports mild anxiety. Patient is on a CIWA.  Q15 minute unit checks in place.

## 2023-01-29 NOTE — H&P (Addendum)
Psychiatric Admission Assessment Adult  Patient Identification: Charles Henry MRN:  [id number] Date of Evaluation:  01/29/2023 Chief Complaint:  Alcohol use disorder, severe, dependence (HCC) [F10.20] Principal Diagnosis: Major depressive disorder, recurrent severe without psychotic features (HCC) Diagnosis:  Principal Problem:   Major depressive disorder, recurrent severe without psychotic features (HCC) Active Problems:   Alcohol use disorder, severe, dependence (HCC)  History of Present Illness:   "I had binged for 3 days, drinking, going to sleep and going to get some more.  I told the police I drank the cleaner and I didn't."  When asked the rationale, he stated, "I don't know, I was drinking."  He denied suicide attempts but he was admitted in 2020 after drinking and cutting his shoulder, requiring 7 sutures and posted it on social media, per notes he " wanted to end it" when [employer] arrived.  On assessment, he denies depression, anxiety, suicidal/homicidal ideations, hallucinations, and withdrawal symptoms along with cravings.  Minimizes his actions and said, "I just need a piece of paper to go to AA as I need to work and pay bills."  Unfortunately, he declined rehab even though he called the police and told them he needed help to stop drinking.  "When they got there, I realized I could do this (stop drinking) myself."  His sleep is typically good, not last night as "I was not at home."  His report of alcohol consumption was "I drink a lot" with 3-4 large beers and vapes Delta 8, nicotine, and marijuana regularly.  Educated him on the negative side effects of Delta 8 and vaping including rebound anxiety.  He denies any health issues or taking medications.    He lives alone and works for [employer].  He has traumas from childhood where he spent most of his time in and out of group homes and foster care.  Trauma from 2020 when he found his girlfriend dead from unknown causes.  He did give permission to  obtain collateral information from the mother of his child, Charles Henry at [phone] and/or his "mother figure", Charles Henry, [phone].  His mother figure was not available.  Later she was and stated regarding safety, "I don't think so.  He's an alcoholic and doesn't want to help himself.  The only person that can help him is himself.  I'm not going to pay his rent this month and he needs to start working because he needs money for rent.  Can we release him tomorrow so can he go to work?"  This provider let her know this would have to be discussed with the team and psychiatrist. She would like him to start naltrexone to assist.  This provider in agreement and medication started after meeting with the client again.  His child's mother, Charles Henry, she is concerned about him drinking after assuring her that he will not be notified of what she is sharing.  When he is not drinking, he is "fine".  However, whenever he drinks he is a threat to himself and others.  "If he does not stop drinking, he is going to die" related to his heavy drinking.  He did stop drinking the first 3 months they were dating.  Associated Signs/Symptoms: Depression Symptoms:  disturbed sleep, (Hypo) Manic Symptoms:  Impulsivity, Anxiety Symptoms:   denies Psychotic Symptoms:   none PTSD Symptoms: Had a traumatic exposure:  foster care most of his childhood Total Time spent with patient: 1 hour  Past Psychiatric History: depression, alcohol dependence  Is the patient  at risk to self? No.  Has the patient been a risk to self in the past 6 months? Yes.    Has the patient been a risk to self within the distant past? Yes.    Is the patient a risk to others? No.  Has the patient been a risk to others in the past 6 months? No.  Has the patient been a risk to others within the distant past? No.   [place] Scale:  Flowsheet Row Admission (Current) from 01/28/2023 in [facility] INPATIENT BEHAV[facility]AL MEDICINE ED from 01/26/2023 in [facility]  Emergency Department at [facility] ED from 11/30/2022 in [facility] Emergency Department at [facility]  C-SSRS RISK CATEGORY Error: Q3, 4, or 5 should not be populated when Q2 is No High Risk Low Risk        Prior Inpatient Therapy: Yes.   If yes, describe:  suicide attempt in 2020 and admitted inpatient Prior Outpatient Therapy: Yes.   None currently  Alcohol Screening: 1. How often do you have a drink containing alcohol?: 2 to 3 times a week 2. How many drinks containing alcohol do you have on a typical day when you are drinking?: 5 or 6 3. How often do you have six or more drinks on one occasion?: Weekly AUDIT-C Score: 8 4. How often during the last year have you found that you were not able to stop drinking once you had started?: Never 5. How often during the last year have you failed to do what was normally expected from you because of drinking?: Never 6. How often during the last year have you needed a first drink in the morning to get yourself going after a heavy drinking session?: Never 7. How often during the last year have you had a feeling of guilt of remorse after drinking?: Never 8. How often during the last year have you been unable to remember what happened the night before because you had been drinking?: Never 9. Have you or someone else been injured as a result of your drinking?: No 10. Has a relative or friend or a doctor or another health worker been concerned about your drinking or suggested you cut down?: No Alcohol Use Disorder Identification Test Final Score (AUDIT): 8 Alcohol Brief Interventions/Follow-up: Alcohol education/Brief advice Substance Abuse History in the last 12 months:  Yes.   Consequences of Substance Abuse: Relationship issues Previous Psychotropic Medications: Yes  Psychological Evaluations: Yes  Past Medical History:  Past Medical History:  Diagnosis Date   ADHD (attention deficit hyperactivity disorder)    Fracture of finger  of left hand 06/2014   left 3rd finger PI fx.   History of asthma    no current med.    Past Surgical History:  Procedure Laterality Date   NO PAST SURGERIES     PERCUTANEOUS PINNING Left 06/09/2014   Procedure: PERCUTANEOUS PINNING EXTREMITY, LEFT 3RD FINGER;  Surgeon: Cheral Almas, MD;  Location: [facility];  Service: Orthopedics;  Laterality: Left;   Family History: History reviewed. No pertinent family history. Family Psychiatric  History: adopted and foster care Tobacco Screening:  Social History   Tobacco Use  Smoking Status Every Day   Types: Cigarettes  Smokeless Tobacco Never    BH Tobacco Counseling     Are you interested in Tobacco Cessation Medications?  Yes, implement Nicotene Replacement Protocol Counseled patient on smoking cessation:  Refused/Declined practical counseling Reason Tobacco Screening Not Completed: No value filed.  Social History:  Social History   Substance and Sexual Activity  Alcohol Use No     Social History   Substance and Sexual Activity  Drug Use No    Additional Social History:  lives alone and works at [employer]    Allergies:   Allergies  Allergen Reactions   Crab [Shellfish Allergy] Swelling    SWELLING OF LIPS  Crab and Lobster   Oak Bark [Quercus Robur]    Lab Results: No results found for this or any previous visit (from the past 48 hour(s)).  Blood Alcohol level:  Lab Results  Component Value Date   ETH 303 (HH) 01/26/2023   ETH 300 (H) 11/30/2022    Metabolic Disorder Labs:  No results found for: "HGBA1C", "MPG" No results found for: "PROLACTIN" No results found for: "CHOL", "TRIG", "HDL", "CHOLHDL", "VLDL", "LDLCALC"  Current Medications: Current Facility-Administered Medications  Medication Dose Route Frequency Provider Last Rate Last Admin   acetaminophen (TYLENOL) tablet 650 mg  650 mg Oral Q6H PRN Onuoha, Josephine C, NP       alum & mag hydroxide-simeth (MAALOX/MYLANTA)  200-200-20 MG/5ML suspension 30 mL  30 mL Oral Q4H PRN Onuoha, Josephine C, NP       diphenhydrAMINE (BENADRYL) capsule 50 mg  50 mg Oral TID PRN Dahlia Byes C, NP       Or   diphenhydrAMINE (BENADRYL) injection 50 mg  50 mg Intramuscular TID PRN Earney Navy, NP       folic acid (FOLVITE) tablet 1 mg  1 mg Oral Daily Onuoha, Josephine C, NP   1 mg at 01/29/23 0851   haloperidol (HALDOL) tablet 5 mg  5 mg Oral TID PRN Dahlia Byes C, NP       Or   haloperidol lactate (HALDOL) injection 5 mg  5 mg Intramuscular TID PRN Dahlia Byes C, NP       LORazepam (ATIVAN) tablet 2 mg  2 mg Oral TID PRN Dahlia Byes C, NP       Or   LORazepam (ATIVAN) injection 2 mg  2 mg Intramuscular TID PRN Dahlia Byes C, NP       LORazepam (ATIVAN) tablet 1-4 mg  1-4 mg Oral Q1H PRN Dahlia Byes C, NP   2 mg at 01/28/23 2202   Or   LORazepam (ATIVAN) tablet 0.5 mg  0.5 mg Oral Q1H PRN Dahlia Byes C, NP       magnesium hydroxide (MILK OF MAGNESIA) suspension 30 mL  30 mL Oral Daily PRN Dahlia Byes C, NP       multivitamin with minerals tablet 1 tablet  1 tablet Oral Daily Dahlia Byes C, NP   1 tablet at 01/29/23 0851   nicotine polacrilex (NICORETTE) gum 2 mg  2 mg Oral PRN Sarina Ill, DO   2 mg at 01/28/23 1757   thiamine (VITAMIN B1) tablet 100 mg  100 mg Oral Daily Dahlia Byes C, NP   100 mg at 01/29/23 4098   Or   thiamine (VITAMIN B1) injection 100 mg  100 mg Intravenous Daily Dahlia Byes C, NP       traZODone (DESYREL) tablet 50 mg  50 mg Oral QHS PRN Charm Rings, NP       PTA Medications: Medications Prior to Admission  Medication Sig Dispense Refill Last Dose   naproxen (NAPROSYN) 375 MG tablet Take 1 tablet twice daily as needed for pain. (Patient not taking: Reported on 01/27/2023) 20 tablet 0  Musculoskeletal: Strength & Muscle Tone: within normal limits Gait & Station: normal Patient leans: N/A   Psychiatric  Specialty Exam: Physical Exam Vitals and nursing note reviewed.  Constitutional:      Appearance: Normal appearance.  HENT:     Head: Normocephalic.     Nose: Nose normal.  Pulmonary:     Effort: Pulmonary effort is normal.  Musculoskeletal:        General: Normal range of motion.     Cervical back: Normal range of motion.  Neurological:     General: No focal deficit present.     Mental Status: He is alert and oriented to person, place, and time.       Review of Systems  Psychiatric/Behavioral:  Positive for substance abuse. The patient has insomnia.   All other systems reviewed and are negative.    Blood pressure 113/84, pulse 91, temperature (!) 89.8 F (32.1 C), resp. rate 17, height 5\' 11"  (1.803 m), weight 67.1 kg, SpO2 100%.Body mass index is 20.64 kg/m.  General Appearance: Casual  Eye Contact:  Good  Speech:  Normal Rate  Volume:  Normal  Mood:  Irritable  Affect:  Congruent  Thought Process:  Coherent  Orientation:  Full (Time, Place, and Person)  Thought Content:  WDL and Logical  Suicidal Thoughts:  No  Homicidal Thoughts:  No  Memory:  Immediate;   Good Recent;   Good Remote;   Good  Judgement:  Fair  Insight:  Fair  Psychomotor Activity:  Normal  Concentration:  Concentration: Good and Attention Span: Good  Recall:  Good  Fund of Knowledge:  Good  Language:  Good  Akathisia:  No  Handed:  Right  AIMS (if indicated):     Assets:  Housing Leisure Time Physical Health Resilience Social Support Transportation Vocational/Educational  ADL's:  Intact  Cognition:  WNL  Sleep:        Physical Exam: Physical Exam Vitals and nursing note reviewed.  Constitutional:      Appearance: Normal appearance.  HENT:     Head: Normocephalic.     Nose: Nose normal.  Pulmonary:     Effort: Pulmonary effort is normal.  Musculoskeletal:        General: Normal range of motion.     Cervical back: Normal range of motion.  Neurological:     General: No focal  deficit present.     Mental Status: He is alert and oriented to person, place, and time.    Review of Systems  Psychiatric/Behavioral:  Positive for substance abuse.   All other systems reviewed and are negative.  Blood pressure 113/84, pulse 91, temperature (!) 89.8 F (32.1 C), resp. rate 17, height 5\' 11"  (1.803 m), weight 67.1 kg, SpO2 100%. Body mass index is 20.64 kg/m.  Treatment Plan Summary: Daily contact with patient to assess and evaluate symptoms and progress in treatment, Medication management, and Plan : Major depressive disorder, recurrent, severe without psychosis: Declines interventions, only wants AA   Alcohol use disorder: Ativan detox protocol started, BAL 303 on 9/23 at 2245 Naltrexone 50 mg daily started   Insomnia: Trazodone 50 mg daily at bedtime PRN  Observation Level/Precautions:  15 minute checks  Laboratory:  Completed in the ED, reviewed, stable  Psychotherapy:  Individual and group therapy  Medications:  See above  Consultations:  None  Discharge Concerns:  none  Estimated LOS:  1-3 days  Other:     Physician Treatment Plan for Primary Diagnosis: Major depressive disorder,  recurrent severe without psychotic features (HCC) Long Term Goal(s): Improvement in symptoms so as ready for discharge  Short Term Goals: Ability to identify changes in lifestyle to reduce recurrence of condition will improve, Ability to verbalize feelings will improve, Ability to disclose and discuss suicidal ideas, Ability to demonstrate self-control will improve, Ability to identify and develop effective coping behaviors will improve, Ability to maintain clinical measurements within normal limits will improve, Compliance with prescribed medications will improve, and Ability to identify triggers associated with substance abuse/mental health issues will improve  Physician Treatment Plan for Secondary Diagnosis: Principal Problem:   Major depressive disorder, recurrent severe  without psychotic features (HCC) Active Problems:   Alcohol use disorder, severe, dependence (HCC)  Long Term Goal(s): Improvement in symptoms so as ready for discharge  Short Term Goals: Ability to identify changes in lifestyle to reduce recurrence of condition will improve, Ability to verbalize feelings will improve, Ability to disclose and discuss suicidal ideas, Ability to demonstrate self-control will improve, Ability to identify and develop effective coping behaviors will improve, Ability to maintain clinical measurements within normal limits will improve, Compliance with prescribed medications will improve, and Ability to identify triggers associated with substance abuse/mental health issues will improve  I certify that inpatient services furnished can reasonably be expected to improve the patient's condition.    Nanine Means, NP 9/26/202410:44 AM

## 2023-01-30 DIAGNOSIS — F332 Major depressive disorder, recurrent severe without psychotic features: Secondary | ICD-10-CM | POA: Diagnosis not present

## 2023-01-30 MED ORDER — NALTREXONE HCL 50 MG PO TABS: 50.0000 mg | ORAL_TABLET | Freq: Every day | ORAL | 0 refills | Status: AC

## 2023-01-30 MED ORDER — TRAZODONE HCL 50 MG PO TABS: 50.0000 mg | ORAL_TABLET | Freq: Every evening | ORAL | 0 refills | Status: AC | PRN

## 2023-01-30 NOTE — BHH Counselor (Signed)
Adult Comprehensive Assessment  Patient ID: Charles Henry, male   DOB: 01-21-1996, 27 y.o.   MRN: [id number]  Information Source: Information source: Patient  Current Stressors:  Patient states their primary concerns and needs for treatment are:: "drinking, drinking, drinking, I had been drinking for like 3 past days. I have an alcohol problem." Patient states their goals for this hospitilization and ongoing recovery are:: "quit drinking"  Living/Environment/Situation:  Living Arrangements: Alone  Family History:     Childhood History:     Education:     Employment/Work Situation:      Architect:      Alcohol/Substance Abuse:      Social Support System:      Leisure/Recreation:      Strengths/Needs:      Discharge Plan:      Summary/Recommendations:   Summary and Recommendations (to be completed by the evaluator): Patient is a 27 year old male [facility] [place] [place] [facility] Patient presents to the hospital for alcohol intoxication and suicide attempt.  Patient was brought in by the [facility].  Patient reports that he has been drinking "a lot of alcohol" prior to admission.  Patient also reported that he drank cleaning solution prior to his admission, however later reported that he did not drink cleaning solution.  Patient reports that major trigger to his current mental health state  has been having limited contact with his young child.  He reports that he also recently lost employment.  Patient also reports some trauma history.  He reports that he does not have a current mental health provider, however, he does not think that he needs one.  He reports that he is only open to attending AA at this time.  Recommendations include: crisis stabilization, therapeutic milieu, encourage group attendance and participation, medication management for detox/mood stabilization and development of comprehensive mental wellness/sobriety plan.  Charles Mo. 01/30/2023

## 2023-01-30 NOTE — BHH Suicide Risk Assessment (Addendum)
[facility] Discharge Suicide Risk Assessment   Principal Problem: Major depressive disorder, recurrent severe without psychotic features (HCC) Discharge Diagnoses: Principal Problem:   Major depressive disorder, recurrent severe without psychotic features (HCC) Active Problems:   Alcohol use disorder, severe, dependence (HCC)   Total Time spent with patient: 45 minutes  Musculoskeletal: Strength & Muscle Tone: within normal limits Gait & Station: normal Patient leans: N/A  Psychiatric Specialty Exam: Physical Exam Vitals and nursing note reviewed.  Constitutional:      Appearance: Normal appearance.  HENT:     Head: Normocephalic.     Nose: Nose normal.  Pulmonary:     Effort: Pulmonary effort is normal.  Musculoskeletal:        General: Normal range of motion.     Cervical back: Normal range of motion.  Neurological:     General: No focal deficit present.     Mental Status: He is alert and oriented to person, place, and time.     Review of Systems  Psychiatric/Behavioral:  Positive for substance abuse.   All other systems reviewed and are negative.   Blood pressure 120/71, pulse (!) 55, temperature (!) 97.5 F (36.4 C), resp. rate 16, height 5\' 11"  (1.803 m), weight 67.1 kg, SpO2 98%.Body mass index is 20.64 kg/m.  General Appearance: Casual  Eye Contact:  Good  Speech:  Normal Rate  Volume:  Normal  Mood:  Euthymic  Affect:  Congruent  Thought Process:  Coherent  Orientation:  Full (Time, Place, and Person)  Thought Content:  WDL and Logical  Suicidal Thoughts:  No  Homicidal Thoughts:  No  Memory:  Immediate;   Good Recent;   Good Remote;   Good  Judgement:  Fair  Insight:  Fair  Psychomotor Activity:  Normal  Concentration:  Concentration: Good and Attention Span: Good  Recall:  Good  Fund of Knowledge:  Good  Language:  Good  Akathisia:  No  Handed:  Right  AIMS (if indicated):     Assets:  Housing Leisure Time Physical Health Resilience Social  Support Transportation Vocational/Educational  ADL's:  Intact  Cognition:  WNL  Sleep:        Physical Exam: Physical Exam Vitals and nursing note reviewed.  Constitutional:      Appearance: Normal appearance.  HENT:     Head: Normocephalic.     Nose: Nose normal.  Pulmonary:     Effort: Pulmonary effort is normal.  Musculoskeletal:        General: Normal range of motion.     Cervical back: Normal range of motion.  Neurological:     General: No focal deficit present.     Mental Status: He is alert and oriented to person, place, and time.    Review of Systems  Psychiatric/Behavioral:  Positive for substance abuse.   All other systems reviewed and are negative.  Blood pressure 120/71, pulse (!) 55, temperature (!) 97.5 F (36.4 C), resp. rate 16, height 5\' 11"  (1.803 m), weight 67.1 kg, SpO2 98%. Body mass index is 20.64 kg/m.  Mental Status Per Nursing Assessment::   On Admission:  NA  Demographic Factors:  Male, Adolescent or young adult, Caucasian, and Living alone  Loss Factors: NA  Historical Factors: Prior suicide attempts and Impulsivity  Risk Reduction Factors:   Sense of responsibility to family, Employed, Positive social support, and Positive coping skills or problem solving skills  Continued Clinical Symptoms:  None  Cognitive Features That Contribute To Risk:  None  Suicide Risk:  Minimal: No identifiable suicidal ideation.  Patients presenting with no risk factors but with morbid ruminations; may be classified as minimal risk based on the severity of the depressive symptoms   Plan Of Care/Follow-up recommendations:  Activity:  as tolerated Diet:  heart healthy diet Major depressive disorder, recurrent, severe without psychosis: Requests AA, resources provided for groups   Alcohol use disorder: Ativan detox protocol started, BAL 303 on 9/23 at 2245   Insomnia: Trazodone 50 mg daily at bedtime PRN  Nanine Means, NP 01/30/2023, 8:21  AM

## 2023-01-30 NOTE — Progress Notes (Signed)
  [facility] Adult Case Management Discharge Plan :  Will you be returning to the same living situation after discharge:  Yes,  pt reports that he is returning home.  At discharge, do you have transportation home?: Yes,  pt reports that a friend will provide transportation. Do you have the ability to pay for your medications: Yes,  [facility]  Release of information consent forms completed and in the chart;  Patient's signature needed at discharge.  Patient to Follow up at:  Follow-up Information     [facility] Follow up.   Why: Walk in hours are from 8AM to 2PM, Monday, Wednesday and Friday Contact information: [address] [place] [address] [phone]         [facility] Follow up.   Specialty: Professional Counselor Why: Walk in hours are from Monday through Friday 9AM 10 1PM. Contact information: [employer] [facility] [place] [address] [place] [address] [phone]         [facility] Follow up.   Why: Walk in hours are from Monday through Friday 9AM 10 1PM. Contact information: [address] [place] [address] [phone]                 Next level of care provider has access to [facility] Link:no  Safety Planning and Suicide Prevention discussed: Yes,  SPE completed with the patient.  Pt declined collateral contact.      Has patient been referred to the Quitline?: Patient refused referral for treatment  Patient has been referred for addiction treatment: Patient refused referral for treatment; referral information given to patient at discharge.  [name], LCSW 01/30/2023, 10:27 AM

## 2023-01-30 NOTE — BHH Suicide Risk Assessment (Signed)
[facility] INPATIENT:  Family/Significant Other Suicide Prevention Education  Suicide Prevention Education:  Patient Refusal for Family/Significant Other Suicide Prevention Education: The patient Charles Henry has refused to provide written consent for family/significant other to be provided Family/Significant Other Suicide Prevention Education during admission and/or prior to discharge.  Physician notified.  [name] 01/30/2023, 3:42 PM

## 2023-01-30 NOTE — Plan of Care (Signed)
  Problem: Education: Goal: Knowledge of General Education information will improve Description: Including pain rating scale, medication(s)/side effects and non-pharmacologic comfort measures Outcome: Progressing   Problem: Health Behavior/Discharge Planning: Goal: Ability to manage health-related needs will improve Outcome: Progressing   Problem: Clinical Measurements: Goal: Ability to maintain clinical measurements within normal limits will improve Outcome: Progressing Goal: Will remain free from infection Outcome: Progressing Goal: Diagnostic test results will improve Outcome: Progressing Goal: Respiratory complications will improve Outcome: Progressing Goal: Cardiovascular complication will be avoided Outcome: Progressing   Problem: Activity: Goal: Risk for activity intolerance will decrease Outcome: Progressing   Problem: Nutrition: Goal: Adequate nutrition will be maintained Outcome: Progressing   Problem: Coping: Goal: Level of anxiety will decrease Outcome: Progressing   Problem: Elimination: Goal: Will not experience complications related to bowel motility Outcome: Progressing Goal: Will not experience complications related to urinary retention Outcome: Progressing   Problem: Pain Managment: Goal: General experience of comfort will improve Outcome: Progressing   Problem: Safety: Goal: Ability to remain free from injury will improve Outcome: Progressing   Problem: Skin Integrity: Goal: Risk for impaired skin integrity will decrease Outcome: Progressing   Problem: Education: Goal: Knowledge of [place] [facility] Education information/materials will improve Outcome: Progressing Goal: Emotional status will improve Outcome: Progressing Goal: Mental status will improve Outcome: Progressing Goal: Verbalization of understanding the information provided will improve Outcome: Progressing   Problem: Activity: Goal: Interest or engagement in activities will  improve Outcome: Progressing Goal: Sleeping patterns will improve Outcome: Progressing   Problem: Coping: Goal: Ability to verbalize frustrations and anger appropriately will improve Outcome: Progressing Goal: Ability to demonstrate self-control will improve Outcome: Progressing   Problem: Health Behavior/Discharge Planning: Goal: Identification of resources available to assist in meeting health care needs will improve Outcome: Progressing Goal: Compliance with treatment plan for underlying cause of condition will improve Outcome: Progressing   Problem: Physical Regulation: Goal: Ability to maintain clinical measurements within normal limits will improve Outcome: Progressing   Problem: Safety: Goal: Periods of time without injury will increase Outcome: Progressing   Problem: Education: Goal: Knowledge of disease or condition will improve Outcome: Progressing Goal: Understanding of discharge needs will improve Outcome: Progressing   Problem: Physical Regulation: Goal: Complications related to the disease process, condition or treatment will be avoided or minimized Outcome: Progressing   Problem: Safety: Goal: Ability to remain free from injury will improve Outcome: Progressing

## 2023-01-30 NOTE — Group Note (Signed)
Date:  01/30/2023 Time:  10:42 AM  Group Topic/Focus:  Goals Group:   The focus of this group is to help patients establish daily goals to achieve during treatment and discuss how the patient can incorporate goal setting into their daily lives to aide in recovery.    Participation Level:  Active  Participation Quality:  Appropriate  Affect:  Appropriate  Cognitive:  Appropriate  Insight: Appropriate  Engagement in Group:  Engaged  Modes of Intervention:  Discussion, Education, and Support  Additional Comments:    Charles Henry 01/30/2023, 10:42 AM

## 2023-01-30 NOTE — BH IP Treatment Plan (Signed)
Interdisciplinary Treatment and Diagnostic Plan Update  01/30/2023 Time of Session: 9:23AM Charles Henry MRN: [id number]  Principal Diagnosis: Major depressive disorder, recurrent severe without psychotic features (HCC)  Secondary Diagnoses: Principal Problem:   Major depressive disorder, recurrent severe without psychotic features (HCC) Active Problems:   Alcohol use disorder, severe, dependence (HCC)   Current Medications:  Current Facility-Administered Medications  Medication Dose Route Frequency Provider Last Rate Last Admin   acetaminophen (TYLENOL) tablet 650 mg  650 mg Oral Q6H PRN Onuoha, Josephine C, NP       alum & mag hydroxide-simeth (MAALOX/MYLANTA) 200-200-20 MG/5ML suspension 30 mL  30 mL Oral Q4H PRN Onuoha, Josephine C, NP       diphenhydrAMINE (BENADRYL) capsule 50 mg  50 mg Oral TID PRN Earney Navy, NP       Or   diphenhydrAMINE (BENADRYL) injection 50 mg  50 mg Intramuscular TID PRN Earney Navy, NP       folic acid (FOLVITE) tablet 1 mg  1 mg Oral Daily Onuoha, Josephine C, NP   1 mg at 01/30/23 0830   haloperidol (HALDOL) tablet 5 mg  5 mg Oral TID PRN Earney Navy, NP       Or   haloperidol lactate (HALDOL) injection 5 mg  5 mg Intramuscular TID PRN Dahlia Byes C, NP       LORazepam (ATIVAN) tablet 2 mg  2 mg Oral TID PRN Dahlia Byes C, NP       Or   LORazepam (ATIVAN) injection 2 mg  2 mg Intramuscular TID PRN Dahlia Byes C, NP       magnesium hydroxide (MILK OF MAGNESIA) suspension 30 mL  30 mL Oral Daily PRN Dahlia Byes C, NP       multivitamin with minerals tablet 1 tablet  1 tablet Oral Daily Dahlia Byes C, NP   1 tablet at 01/30/23 0830   naltrexone (DEPADE) tablet 50 mg  50 mg Oral Daily Charm Rings, NP   50 mg at 01/30/23 0830   nicotine polacrilex (NICORETTE) gum 2 mg  2 mg Oral PRN Sarina Ill, DO   2 mg at 01/30/23 1204   thiamine (VITAMIN B1) tablet 100 mg  100 mg Oral Daily Onuoha,  Josephine C, NP   100 mg at 01/30/23 0830   Or   thiamine (VITAMIN B1) injection 100 mg  100 mg Intravenous Daily Dahlia Byes C, NP       traZODone (DESYREL) tablet 50 mg  50 mg Oral QHS PRN Charm Rings, NP   50 mg at 01/29/23 2116   Current Outpatient Medications  Medication Sig Dispense Refill   naltrexone (DEPADE) 50 MG tablet Take 1 tablet (50 mg total) by mouth daily. 90 tablet 0   traZODone (DESYREL) 50 MG tablet Take 1 tablet (50 mg total) by mouth at bedtime as needed for sleep. 90 tablet 0   PTA Medications: No medications prior to admission.    Patient Stressors: Substance abuse    Patient Strengths: Capable of independent living  Forensic psychologist fund of knowledge  Motivation for treatment/growth   Treatment Modalities: Medication Management, Group therapy, Case management,  1 to 1 session with clinician, Psychoeducation, Recreational therapy.   Physician Treatment Plan for Primary Diagnosis: Major depressive disorder, recurrent severe without psychotic features (HCC) Long Term Goal(s): Improvement in symptoms so as ready for discharge   Short Term Goals: Ability to identify changes in lifestyle to reduce recurrence of  condition will improve Ability to verbalize feelings will improve Ability to disclose and discuss suicidal ideas Ability to demonstrate self-control will improve Ability to identify and develop effective coping behaviors will improve Ability to maintain clinical measurements within normal limits will improve Compliance with prescribed medications will improve Ability to identify triggers associated with substance abuse/mental health issues will improve  Medication Management: Evaluate patient's response, side effects, and tolerance of medication regimen.  Therapeutic Interventions: 1 to 1 sessions, Unit Group sessions and Medication administration.  Evaluation of Outcomes: Adequate for Discharge  Physician Treatment Plan for  Secondary Diagnosis: Principal Problem:   Major depressive disorder, recurrent severe without psychotic features (HCC) Active Problems:   Alcohol use disorder, severe, dependence (HCC)  Long Term Goal(s): Improvement in symptoms so as ready for discharge   Short Term Goals: Ability to identify changes in lifestyle to reduce recurrence of condition will improve Ability to verbalize feelings will improve Ability to disclose and discuss suicidal ideas Ability to demonstrate self-control will improve Ability to identify and develop effective coping behaviors will improve Ability to maintain clinical measurements within normal limits will improve Compliance with prescribed medications will improve Ability to identify triggers associated with substance abuse/mental health issues will improve     Medication Management: Evaluate patient's response, side effects, and tolerance of medication regimen.  Therapeutic Interventions: 1 to 1 sessions, Unit Group sessions and Medication administration.  Evaluation of Outcomes: Adequate for Discharge   RN Treatment Plan for Primary Diagnosis: Major depressive disorder, recurrent severe without psychotic features (HCC) Long Term Goal(s): Knowledge of disease and therapeutic regimen to maintain health will improve  Short Term Goals: Ability to demonstrate self-control, Ability to participate in decision making will improve, Ability to verbalize feelings will improve, Ability to disclose and discuss suicidal ideas, Ability to identify and develop effective coping behaviors will improve, and Compliance with prescribed medications will improve  Medication Management: RN will administer medications as ordered by provider, will assess and evaluate patient's response and provide education to patient for prescribed medication. RN will report any adverse and/or side effects to prescribing provider.  Therapeutic Interventions: 1 on 1 counseling sessions,  Psychoeducation, Medication administration, Evaluate responses to treatment, Monitor vital signs and CBGs as ordered, Perform/monitor CIWA, COWS, AIMS and Fall Risk screenings as ordered, Perform wound care treatments as ordered.  Evaluation of Outcomes: Adequate for Discharge   LCSW Treatment Plan for Primary Diagnosis: Major depressive disorder, recurrent severe without psychotic features (HCC) Long Term Goal(s): Safe transition to appropriate next level of care at discharge, Engage patient in therapeutic group addressing interpersonal concerns.  Short Term Goals: Engage patient in aftercare planning with referrals and resources, Increase social support, Increase ability to appropriately verbalize feelings, Increase emotional regulation, Facilitate acceptance of mental health diagnosis and concerns, Facilitate patient progression through stages of change regarding substance use diagnoses and concerns, Identify triggers associated with mental health/substance abuse issues, and Increase skills for wellness and recovery  Therapeutic Interventions: Assess for all discharge needs, 1 to 1 time with Social worker, Explore available resources and support systems, Assess for adequacy in community support network, Educate family and significant other(s) on suicide prevention, Complete Psychosocial Assessment, Interpersonal group therapy.  Evaluation of Outcomes: Adequate for Discharge   Progress in Treatment: Attending groups: Yes. Participating in groups: Yes. Taking medication as prescribed: Yes. Toleration medication: Yes. Family/Significant other contact made: No, will contact:  once permission has been given Patient understands diagnosis: Yes. Discussing patient identified problems/goals with staff: Yes. Medical problems  stabilized or resolved: Yes. Denies suicidal/homicidal ideation: Yes. Issues/concerns per patient self-inventory: No. Other: none  New problem(s) identified: No, Describe:   none  New Short Term/Long Term Goal(s): detox, elimination of symptoms of psychosis, medication management for mood stabilization; elimination of SI thoughts; development of comprehensive mental wellness/sobriety plan.   Patient Goals:  "quit drinking"  Discharge Plan or Barriers: Patient reports plans to return home and begin AA/NA. CSW recommended patient have an appointment for [facility] or referral for residential, however, patient declined.  Reason for Continuation of Hospitalization: Anxiety Depression Medication stabilization Suicidal ideation  Estimated Length of Stay:  1-7 days  Last 3 [place] Suicide Severity Risk Score: Flowsheet Row Admission (Discharged) from 01/28/2023 in [facility]ICINE ED from 01/26/2023 in [facility] Department at [facility] ED from 11/30/2022 in [facility] Emergency Department at [facility]  C-SSRS RISK CATEGORY Error: Q3, 4, or 5 should not be populated when Q2 is No High Risk Low Risk       Last PHQ 2/9 Scores:     No data to display          Scribe for Treatment Team: Gaylan Gerold 01/30/2023 3:42 PM

## 2023-01-30 NOTE — Group Note (Signed)
Recreation Therapy Group Note   Group Topic:Leisure Education  Group Date: 01/30/2023 Start Time: 1000 End Time: 1100 Facilitators: Rosina Lowenstein, LRT, CTRS Location:  Craft Room  Group Description: Leisure. Patients were given the option to choose from singing karaoke, coloring mandalas, using oil pastels, journaling, or playing with play-doh. LRT and pts discussed the meaning of leisure, the importance of participating in leisure during their free time/when they're outside of the hospital, as well as how our leisure interests can also serve as coping skills.    Goal Area(s) Addressed:  Patient will identify a current leisure interest.  Patient will learn the definition of "leisure". Patient will practice making a positive decision. Patient will have the opportunity to try a new leisure activity. Patient will communicate with peers and LRT.    Affect/Mood: Appropriate   Participation Level: Active and Engaged   Participation Quality: Independent   Behavior: Calm and Cooperative   Speech/Thought Process: Coherent   Insight: Good   Judgement: Good   Modes of Intervention: Activity   Patient Response to Interventions:  Attentive, Engaged, Interested , and Receptive   Education Outcome:  Acknowledges education   Clinical Observations/Individualized Feedback: Charles Henry was active in their participation of session activities and group discussion. Pt came late to group, however joined with no issue. Pt chose to play with playdoh while in group. Pt interacted well with LRT and peers duration of session.   Plan: Continue to engage patient in RT group sessions 2-3x/week.   Rosina Lowenstein, LRT, CTRS 01/30/2023 11:43 AM

## 2023-01-30 NOTE — Discharge Summary (Signed)
Physician Discharge Summary Note  Patient:  Charles Henry is an 27 y.o., male MRN:  [id number] DOB:  [birth year] Patient phone:  [phone] (home)  Patient address:   [address] [place] [address],  Total Time spent with patient: 45 minutes  Date of Admission:  01/28/2023 Date of Discharge: 01/30/2023  Reason for Admission:  alcohol intoxication with suicide threat  Principal Problem: Major depressive disorder, recurrent severe without psychotic features [facility]) Discharge Diagnoses: Principal Problem:   Major depressive disorder, recurrent severe without psychotic features (HCC) Active Problems:   Alcohol use disorder, severe, dependence (HCC)   Past Psychiatric History: alcohol dependence, ADHD  Past Medical History:  Past Medical History:  Diagnosis Date   ADHD (attention deficit hyperactivity disorder)    Fracture of finger of left hand 06/2014   left 3rd finger PI fx.   History of asthma    no current med.    Past Surgical History:  Procedure Laterality Date   NO PAST SURGERIES     PERCUTANEOUS PINNING Left 06/09/2014   Procedure: PERCUTANEOUS PINNING EXTREMITY, LEFT 3RD FINGER;  Surgeon: Cheral Almas, MD;  Location: [facility];  Service: Orthopedics;  Laterality: Left;   Family History: History reviewed. No pertinent family history. Family Psychiatric  History: adopted Social History:  Social History   Substance and Sexual Activity  Alcohol Use No     Social History   Substance and Sexual Activity  Drug Use No    Social History   Socioeconomic History   Marital status: Single    Spouse name: Not on file   Number of children: Not on file   Years of education: Not on file   Highest education level: Not on file  Occupational History   Not on file  Tobacco Use   Smoking status: Every Day    Types: Cigarettes   Smokeless tobacco: Never  Substance and Sexual Activity   Alcohol use: No   Drug use: No   Sexual activity:  Not on file  Other Topics Concern   Not on file  Social History Narrative   Not on file   Social Determinants of Health   Financial Resource Strain: Not on file  Food Insecurity: No Food Insecurity (01/28/2023)   Hunger Vital Sign    Worried About Running Out of Food in the Last Year: Never true    Ran Out of Food in the Last Year: Never true  Transportation Needs: No Transportation Needs (01/28/2023)   PRAPARE - Administrator, Civil Service (Medical): No    Lack of Transportation (Non-Medical): No  Physical Activity: Not on file  Stress: Not on file  Social Connections: Unknown (09/16/2021)   Received from [facility]   Social Network    Social Network: Not on file    Hospital Course:   27 yo male admitted after drinking excessively and having suicidal ideations.  He called the police and told them he drank cleaning fluid and later recanted this when he was sober, adamant that he was not trying to end his life.  Continues to deny depression, anxiety, suicidal/homicidal ideations, hallucinations, paranoia, and withdrawal symptoms.  He was agreeable to start naltrexone and desires to follow up with AA to maintain his sobriety.  His mother figure/guardian, [name], would like him to discharge so he can return to work and pay his bills, no safety concerns.  His girlfriend, baby's mother, is going to come and get him.  He has  met maximum capacity of hospitalization. Discharge instructions provided along with Rx, crisis numbers, and information on AA group meetings and times in his area.  Physical Findings: AIMS:  , ,  ,  ,    CIWA:  CIWA-Ar Total: 1 COWS:     Musculoskeletal: Strength & Muscle Tone: within normal limits Gait & Station: normal Patient leans: N/A  Psychiatric Specialty Exam: Physical Exam Vitals and nursing note reviewed.  Constitutional:      Appearance: Normal appearance.  HENT:     Head: Normocephalic.     Nose: Nose normal.  Pulmonary:      Effort: Pulmonary effort is normal.  Musculoskeletal:        General: Normal range of motion.     Cervical back: Normal range of motion.  Neurological:     General: No focal deficit present.     Mental Status: He is alert and oriented to person, place, and time.     Review of Systems  Psychiatric/Behavioral:  Positive for substance abuse.   All other systems reviewed and are negative.   Blood pressure 120/71, pulse (!) 55, temperature (!) 97.5 F (36.4 C), resp. rate 16, height 5\' 11"  (1.803 m), weight 67.1 kg, SpO2 98%.Body mass index is 20.64 kg/m.  General Appearance: Casual  Eye Contact:  Good  Speech:  Normal Rate  Volume:  Normal  Mood:  Euthymic  Affect:  Congruent  Thought Process:  Coherent  Orientation:  Full (Time, Place, and Person)  Thought Content:  WDL and Logical  Suicidal Thoughts:  No  Homicidal Thoughts:  No  Memory:  Immediate;   Good Recent;   Good Remote;   Good  Judgement:  Fair  Insight:  Fair  Psychomotor Activity:  Normal  Concentration:  Concentration: Good and Attention Span: Good  Recall:  Good  Fund of Knowledge:  Good  Language:  Good  Akathisia:  No  Handed:  Right  AIMS (if indicated):     Assets:  Housing Leisure Time Physical Health Resilience Social Support Transportation Vocational/Educational  ADL's:  Intact  Cognition:  WNL  Sleep:        Physical Exam: Physical Exam Vitals and nursing note reviewed.  Constitutional:      Appearance: Normal appearance.  HENT:     Head: Normocephalic.     Nose: Nose normal.  Pulmonary:     Effort: Pulmonary effort is normal.  Musculoskeletal:        General: Normal range of motion.     Cervical back: Normal range of motion.  Neurological:     General: No focal deficit present.     Mental Status: He is alert and oriented to person, place, and time.    Review of Systems  Psychiatric/Behavioral:  Positive for substance abuse.   All other systems reviewed and are  negative.  Blood pressure 120/71, pulse (!) 55, temperature (!) 97.5 F (36.4 C), resp. rate 16, height 5\' 11"  (1.803 m), weight 67.1 kg, SpO2 98%. Body mass index is 20.64 kg/m.   Social History   Tobacco Use  Smoking Status Every Day   Types: Cigarettes  Smokeless Tobacco Never   Tobacco Cessation:  A prescription for an FDA-approved tobacco cessation medication was offered at discharge and the patient refused   Blood Alcohol level:  Lab Results  Component Value Date   ETH 303 (HH) 01/26/2023   ETH 300 (H) 11/30/2022    Metabolic Disorder Labs:  No results found for: "HGBA1C", "MPG" No  results found for: "PROLACTIN" No results found for: "CHOL", "TRIG", "HDL", "CHOLHDL", "VLDL", "LDLCALC"  See Psychiatric Specialty Exam and Suicide Risk Assessment completed by Attending Physician prior to discharge.  Discharge destination:  Home  Is patient on multiple antipsychotic therapies at discharge:  No   Has Patient had three or more failed trials of antipsychotic monotherapy by history:  No  Recommended Plan for Multiple Antipsychotic Therapies: NA  Discharge Instructions     Diet - low sodium heart healthy   Complete by: As directed    Discharge instructions   Complete by: As directed    Follow up with AA   Increase activity slowly   Complete by: As directed       Allergies as of 01/30/2023       Reactions   Crab [shellfish Allergy] Swelling   SWELLING OF LIPS Crab and Lobster   Oak Bark [quercus Robur]         Medication List     STOP taking these medications    naproxen 375 MG tablet Commonly known as: NAPROSYN       TAKE these medications      Indication  naltrexone 50 MG tablet Commonly known as: DEPADE Take 1 tablet (50 mg total) by mouth daily.  Indication: Abuse or Misuse of Alcohol   traZODone 50 MG tablet Commonly known as: DESYREL Take 1 tablet (50 mg total) by mouth at bedtime as needed for sleep.  Indication: Trouble Sleeping          Follow-up recommendations:   Activity:  as tolerated Diet:  heart healthy diet Major depressive disorder, recurrent, severe without psychosis: Desires AA, resources provided   Alcohol use disorder: Ativan detox protocol started, BAL 303 on 9/23 at 2245   Insomnia: Trazodone 50 mg daily at bedtime PRN  Comments:  follow up with AA  Signed: Nanine Means, NP 01/30/2023, 8:24 AM

## 2023-01-30 NOTE — Progress Notes (Signed)
Patient is discharging at this time. Patient is A&Ox4. Stable. Patient denies SI,HI, and A/V/H with no plan/intent. Printed AVS reviewed with and given to patient along with medications and follow up appointments. Suicide safety plan complete with copy provided to patient. Original form in chart. Patient verbalized all understanding. All valuables/belongings returned to patient. Patient is being transported by his mother figure. Patient denies any pain/discomfort. No s/s of current distress.

## 2024-03-05 ENCOUNTER — Emergency Department (HOSPITAL_COMMUNITY)
Admission: EM | Admit: 2024-03-05 | Discharge: 2024-03-05 | Disposition: A | Discharge status: Home / Self Care | Payer: MEDICAID | Attending: Emergency Medicine | Admitting: Emergency Medicine

## 2024-03-05 ENCOUNTER — Encounter (HOSPITAL_COMMUNITY): Payer: Self-pay | Admitting: Emergency Medicine

## 2024-03-05 ENCOUNTER — Other Ambulatory Visit: Payer: Self-pay

## 2024-03-05 DIAGNOSIS — F1090 Alcohol use, unspecified, uncomplicated: Secondary | ICD-10-CM | POA: Diagnosis not present

## 2024-03-05 DIAGNOSIS — M549 Dorsalgia, unspecified: Secondary | ICD-10-CM | POA: Diagnosis present

## 2024-03-05 DIAGNOSIS — Z5329 Procedure and treatment not carried out because of patient's decision for other reasons: Secondary | ICD-10-CM | POA: Diagnosis not present

## 2024-03-05 DIAGNOSIS — R10A3 Flank pain, bilateral: Secondary | ICD-10-CM | POA: Diagnosis not present

## 2024-03-05 NOTE — ED Notes (Addendum)
 Pt wanting to leave, provider notified. Pt made aware of the risks of leaving against medical advised by the provider. Pt signed AMA form.

## 2024-03-05 NOTE — ED Provider Notes (Signed)
 [place] EMERGENCY DEPARTMENT AT [facility] Provider Note   CSN: [id number] Arrival date & time: 03/05/24  1208     Patient presents with: Back Pain   Carlton Buskey is a 28 y.o. male.  28 year old male presents to the ED via EMS for complaints of months of kidney pain.  Patient reports he has been drinking almost daily for the last 6 years.  He reports that he wants to get help to stop drinking.  Patient also endorses smoking marijuana.  Patient denies any other drug use.  Patient denies any SI or HI.  Patient denies any other associated symptoms including urinary symptoms, shortness of breath, chest pain, dizziness, or headache.     Prior to Admission medications   Medication Sig Start Date End Date Taking? Authorizing Provider  traZODone  (DESYREL ) 50 MG tablet Take 1 tablet (50 mg total) by mouth at bedtime as needed for sleep. 01/30/23 04/30/23  Jacquetta Sharlot GRADE, NP    Allergies: Georgianne cerise allergy] and Izell baton [quercus robur]    Review of Systems  Musculoskeletal:  Positive for back pain.  All other systems reviewed and are negative.   Updated Vital Signs BP (!) 128/109 (BP Location: Left Arm)   Pulse (!) 103   Temp 98.7 F (37.1 C) (Oral)   Resp 16   Ht 5' 11 (1.803 m)   Wt 67.1 kg   SpO2 98%   BMI 20.64 kg/m   Physical Exam Vitals and nursing note reviewed.  Constitutional:      Appearance: Normal appearance.  HENT:     Head: Normocephalic and atraumatic.     Nose: Nose normal.  Eyes:     General: No scleral icterus.    Extraocular Movements: Extraocular movements intact.     Conjunctiva/sclera: Conjunctivae normal.     Pupils: Pupils are equal, round, and reactive to light.  Cardiovascular:     Rate and Rhythm: Normal rate.  Pulmonary:     Effort: Pulmonary effort is normal. No respiratory distress.     Breath sounds: Normal breath sounds. No wheezing or rales.  Abdominal:     General: Abdomen is flat.     Tenderness: There is no  abdominal tenderness. There is no right CVA tenderness, left CVA tenderness or guarding.     Comments: No rashes or distention noted on abdominal exam.  No pain to palpation in abdomen.  Musculoskeletal:        General: Normal range of motion.     Cervical back: Normal range of motion.     Comments: No edema noted on the distal extremities.  Skin:    General: Skin is warm.     Capillary Refill: Capillary refill takes less than 2 seconds.     Coloration: Skin is not jaundiced.  Neurological:     General: No focal deficit present.     Mental Status: He is alert.  Psychiatric:        Mood and Affect: Mood normal.        Behavior: Behavior normal.     (all labs ordered are listed, but only abnormal results are displayed) Labs Reviewed - No data to display   EKG: None  Radiology: No results found.   Procedures   Medications Ordered in the ED - No data to display  28 y.o. male presents to the ED with complaints of bilateral flank pain for months, this involves an extensive number of treatment options, and is a complaint that carries with  it a high risk of complications and morbidity.  The differential diagnosis includes UTI, pyelonephritis, nephrolithiasis, alcohol withdrawal (Ddx)  On arrival pt is nontoxic, vitals significant for tachycardia and mildly elevated blood pressure. Exam significant for reported pain CVA pain without CVA tenderness.    Lab Tests:  I Ordered, reviewed, and interpreted labs, which included: CMP, CBC, UA, lipase, UDS, ethanol Patient left AMA prior to labs being collected.  ED Course:   On initial exam patient is sitting comfortably on bed feeling of pain using pain to staff.  Patient is in no acute distress and nontoxic-appearing.  Patient admits to drinking for several years and wanting help to stop drinking.  Patient denies any SI or HI.  After initial exam nurse advised he was wanting to leave AMA.  Portions of this note were generated with  Scientist, clinical (histocompatibility and immunogenetics). Dictation errors may occur despite best attempts at proofreading.   Final diagnoses:  None    ED Discharge Orders     None          Myriam Fonda GORMAN DEVONNA 03/05/24 2026    Yolande Lamar BROCKS, MD 03/09/24 [phone]
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
## 2024-03-07 ENCOUNTER — Other Ambulatory Visit: Payer: Self-pay

## 2024-03-07 ENCOUNTER — Emergency Department (HOSPITAL_BASED_OUTPATIENT_CLINIC_OR_DEPARTMENT_OTHER)
Admission: EM | Admit: 2024-03-07 | Discharge: 2024-03-07 | Disposition: A | Discharge status: Home / Self Care | Payer: MEDICAID | Attending: Emergency Medicine | Admitting: Emergency Medicine

## 2024-03-07 ENCOUNTER — Emergency Department (HOSPITAL_BASED_OUTPATIENT_CLINIC_OR_DEPARTMENT_OTHER): Payer: MEDICAID

## 2024-03-07 ENCOUNTER — Encounter (HOSPITAL_BASED_OUTPATIENT_CLINIC_OR_DEPARTMENT_OTHER): Payer: Self-pay

## 2024-03-07 DIAGNOSIS — R10A3 Flank pain, bilateral: Secondary | ICD-10-CM | POA: Insufficient documentation

## 2024-03-07 DIAGNOSIS — F1010 Alcohol abuse, uncomplicated: Secondary | ICD-10-CM

## 2024-03-07 DIAGNOSIS — M549 Dorsalgia, unspecified: Secondary | ICD-10-CM | POA: Diagnosis not present

## 2024-03-07 LAB — URINALYSIS, ROUTINE W REFLEX MICROSCOPIC
Bilirubin Urine: NEGATIVE
Glucose, UA: NEGATIVE mg/dL
Hgb urine dipstick: NEGATIVE
Ketones, ur: 15 mg/dL — AB
Leukocytes,Ua: NEGATIVE
Nitrite: NEGATIVE
Specific Gravity, Urine: 1.027 (ref 1.005–1.030)
pH: 6 (ref 5.0–8.0)

## 2024-03-07 LAB — BASIC METABOLIC PANEL WITH GFR
Anion gap: 18 — ABNORMAL HIGH (ref 5–15)
BUN: 10 mg/dL (ref 6–20)
CO2: 23 mmol/L (ref 22–32)
Calcium: 10.1 mg/dL (ref 8.9–10.3)
Chloride: 99 mmol/L (ref 98–111)
Creatinine, Ser: 0.89 mg/dL (ref 0.61–1.24)
GFR, Estimated: >60 mL/min (ref >60)
Glucose, Bld: 106 mg/dL — ABNORMAL HIGH (ref 70–99)
Potassium: 4 mmol/L (ref 3.5–5.1)
Sodium: 140 mmol/L (ref 135–145)

## 2024-03-07 LAB — CBC
HCT: 45 % (ref 39.0–52.0)
Hemoglobin: 15.5 g/dL (ref 13.0–17.0)
MCH: 31.2 pg (ref 26.0–34.0)
MCHC: 34.4 g/dL (ref 30.0–36.0)
MCV: 90.5 fL (ref 80.0–100.0)
Platelets: 291 K/uL (ref 150–400)
RBC: 4.97 MIL/uL (ref 4.22–5.81)
RDW: 13.4 % (ref 11.5–15.5)
WBC: 8.3 K/uL (ref 4.0–10.5)
nRBC: 0 % (ref 0.0–0.2)

## 2024-03-07 NOTE — Discharge Instructions (Signed)
 Workup and labs including urinalysis negative.  CT scan without any acute findings.  Would recommend following up with your primary care doctor regarding this or with the wellness clinic.  Also as we discussed would recommend following up with [place] [facility] regarding the alcohol abuse.

## 2024-03-07 NOTE — ED Notes (Signed)

## 2024-03-07 NOTE — ED Triage Notes (Signed)
 Patient reports heavy alcohol use for over 6 years. Says he has kidney pain from it. He also reports he wants help quitting drinking. Last drink was last night. He denies hx of Dts from withdrawal.

## 2024-03-07 NOTE — ED Provider Notes (Addendum)
 [place] EMERGENCY DEPARTMENT AT [facility] Provider Note   CSN: [id number] Arrival date & time: 03/07/24  9175     Patient presents with: Back Pain, Flank Pain, and Withdrawal   Charles Henry is a 28 y.o. male.   Patient with a complaint of bilateral flank pain CVA pain for several months.  Is admitting to heavy drinking.  Was seen at [facility] on November 1 but left before labs were drawn.  But he was evaluated.  He does have some community resources available and did mention behavioral health urgent care as a potential place for follow-up and help with the alcohol abuse.  Patient is just really concerned about the bilateral flank pain.  Temp here 98.4 pulse originally was 130 but now it is in low 100s.  Respirations 18 blood pressure 150/107 oxygen sats 98% on room air.  Patient denies any hallucinations or tremors.  No nausea no vomiting.       Prior to Admission medications   Medication Sig Start Date End Date Taking? Authorizing Provider  traZODone  (DESYREL ) 50 MG tablet Take 1 tablet (50 mg total) by mouth at bedtime as needed for sleep. 01/30/23 04/30/23  Jacquetta Sharlot GRADE, NP    Allergies: Georgianne cerise allergy] and Izell baton [quercus robur]    Review of Systems  Constitutional:  Negative for chills and fever.  HENT:  Negative for ear pain and sore throat.   Eyes:  Negative for pain and visual disturbance.  Respiratory:  Negative for cough and shortness of breath.   Cardiovascular:  Negative for chest pain and palpitations.  Gastrointestinal:  Negative for abdominal pain and vomiting.  Genitourinary:  Positive for flank pain. Negative for dysuria and hematuria.  Musculoskeletal:  Positive for back pain. Negative for arthralgias.  Skin:  Negative for color change and rash.  Neurological:  Negative for seizures and syncope.  All other systems reviewed and are negative.   Updated Vital Signs BP (!) 131/92   Pulse (!) 111   Temp 98.4 F (36.9 C)   Resp 20    SpO2 96%   Physical Exam Vitals and nursing note reviewed.  Constitutional:      General: He is not in acute distress.    Appearance: Normal appearance. He is well-developed.  HENT:     Head: Normocephalic and atraumatic.  Eyes:     Extraocular Movements: Extraocular movements intact.     Conjunctiva/sclera: Conjunctivae normal.     Pupils: Pupils are equal, round, and reactive to light.  Cardiovascular:     Rate and Rhythm: Normal rate and regular rhythm.     Heart sounds: No murmur heard. Pulmonary:     Effort: Pulmonary effort is normal. No respiratory distress.     Breath sounds: Normal breath sounds.  Abdominal:     General: There is no distension.     Palpations: Abdomen is soft.     Tenderness: There is no abdominal tenderness. There is no guarding.  Musculoskeletal:        General: No swelling.     Cervical back: Normal range of motion and neck supple.  Skin:    General: Skin is warm and dry.     Capillary Refill: Capillary refill takes less than 2 seconds.  Neurological:     General: No focal deficit present.     Mental Status: He is alert and oriented to person, place, and time.  Psychiatric:        Mood and Affect: Mood normal.     (  all labs ordered are listed, but only abnormal results are displayed) Labs Reviewed  URINALYSIS, ROUTINE W REFLEX MICROSCOPIC - Abnormal; Notable for the following components:      Result Value   Ketones, ur 15 (*)    Protein, ur TRACE (*)    All other components within normal limits  BASIC METABOLIC PANEL WITH GFR - Abnormal; Notable for the following components:   Glucose, Bld 106 (*)    Anion gap 18 (*)    All other components within normal limits  CBC    EKG: None  Radiology: No results found.   Procedures   Medications Ordered in the ED - No data to display                                  Medical Decision Making Amount and/or Complexity of Data Reviewed Labs: ordered. Radiology: ordered.  Patient  seen at [facility] on Saturday left before labs were drawn.  But he was evaluated.  Urinalysis here today is negative.  Basic metabolic panel normal including renal function.  CBC normal.  Patient is a little tachycardic.  But does not appear to be going through significant alcohol withdrawal.  Patient does have outpatient information for follow-up.  Also did mention the [place] [facility] urgent [facility].  Will get CT renal just to make sure everything looks normal.  As stated urinalysis was normal.  No acute noncontrast CT findings of the abdomen and pelvis to explain the flank pain.  Will treat patient symptomatically.  But not with pain medication.  Will give him follow-up with wellness clinic and we already discussed behavioral health follow-up for the alcohol substance dependence. Patient seen at [facility] on November 1.  But left AMA before labs were done.  But he was evaluated.  Patient's labs today urinalysis negative  Final diagnoses:  Bilateral flank pain    ED Discharge Orders     None          Geraldene Hamilton, MD 03/07/24 1022    Geraldene Hamilton, MD 03/07/24 1211

## 2024-05-19 ENCOUNTER — Emergency Department (HOSPITAL_COMMUNITY)
Admission: EM | Admit: 2024-05-19 | Discharge: 2024-05-19 | Disposition: A | Discharge status: Home / Self Care | Payer: MEDICAID | Attending: Emergency Medicine | Admitting: Emergency Medicine

## 2024-05-19 DIAGNOSIS — Z23 Encounter for immunization: Secondary | ICD-10-CM | POA: Insufficient documentation

## 2024-05-19 DIAGNOSIS — T148XXA Other injury of unspecified body region, initial encounter: Secondary | ICD-10-CM

## 2024-05-19 DIAGNOSIS — S41131A Puncture wound without foreign body of right upper arm, initial encounter: Secondary | ICD-10-CM | POA: Diagnosis present

## 2024-05-19 LAB — CBC WITH DIFFERENTIAL/PLATELET
Abs Immature Granulocytes: 0.03 K/uL (ref 0.00–0.07)
Basophils Absolute: 0 K/uL (ref 0.0–0.1)
Basophils Relative: 0 %
Eosinophils Absolute: 0.1 K/uL (ref 0.0–0.5)
Eosinophils Relative: 2 %
HCT: 44.2 % (ref 39.0–52.0)
Hemoglobin: 15.1 g/dL (ref 13.0–17.0)
Immature Granulocytes: 0 %
Lymphocytes Relative: 43 %
Lymphs Abs: 3.3 K/uL (ref 0.7–4.0)
MCH: 31 pg (ref 26.0–34.0)
MCHC: 34.2 g/dL (ref 30.0–36.0)
MCV: 90.8 fL (ref 80.0–100.0)
Monocytes Absolute: 0.4 K/uL (ref 0.1–1.0)
Monocytes Relative: 5 %
Neutro Abs: 3.8 K/uL (ref 1.7–7.7)
Neutrophils Relative %: 50 %
Platelets: 281 K/uL (ref 150–400)
RBC: 4.87 MIL/uL (ref 4.22–5.81)
RDW: 13.1 % (ref 11.5–15.5)
WBC: 7.7 K/uL (ref 4.0–10.5)
nRBC: 0 % (ref 0.0–0.2)

## 2024-05-19 LAB — BASIC METABOLIC PANEL WITH GFR
Anion gap: 14 (ref 5–15)
BUN: 6 mg/dL (ref 6–20)
CO2: 25 mmol/L (ref 22–32)
Calcium: 8.8 mg/dL — ABNORMAL LOW (ref 8.9–10.3)
Chloride: 101 mmol/L (ref 98–111)
Creatinine, Ser: 0.83 mg/dL (ref 0.61–1.24)
GFR, Estimated: >60 mL/min
Glucose, Bld: 85 mg/dL (ref 70–99)
Potassium: 3.5 mmol/L (ref 3.5–5.1)
Sodium: 140 mmol/L (ref 135–145)

## 2024-05-19 MED ORDER — TETANUS-DIPHTH-ACELL PERTUSSIS 5-2-15.5 LF-MCG/0.5 IM SUSP
0.5000 mL | Freq: Once | INTRAMUSCULAR | Status: AC
  Administered 2024-05-19: 0.5 mL via INTRAMUSCULAR
  Filled 2024-05-19: qty 0.5

## 2024-05-19 MED ORDER — LIDOCAINE-EPINEPHRINE (PF) 2 %-1:200000 IJ SOLN
10.0000 mL | Freq: Once | INTRAMUSCULAR | Status: AC
  Administered 2024-05-19: 10 mL
  Filled 2024-05-19: qty 20

## 2024-05-19 MED ORDER — BACITRACIN ZINC 500 UNIT/GM EX OINT: 1.0000 | TOPICAL_OINTMENT | Freq: Two times a day (BID) | CUTANEOUS | 0 refills | Status: AC

## 2024-05-19 NOTE — ED Provider Notes (Signed)
.  Laceration Repair  Date/Time: 05/19/2024 2:44 PM  Performed by: Harold Tillman DASEN, PA-C Authorized by: Harold Tillman DASEN, PA-C   Consent:    Consent obtained:  Verbal   Consent given by:  Patient   Risks, benefits, and alternatives were discussed: yes     Risks discussed:  Infection, poor wound healing and pain   Alternatives discussed:  No treatment Universal protocol:    Procedure explained and questions answered to patient or proxy's satisfaction: yes     Relevant documents present and verified: yes     Test results available: yes     Imaging studies available: yes     Required blood products, implants, devices, and special equipment available: yes     Site/side marked: yes     Immediately prior to procedure, a time out was called: yes     Patient identity confirmed:  Verbally with patient and arm band Anesthesia:    Anesthesia method:  Local infiltration   Local anesthetic:  Lidocaine  2% w/o epi Laceration details:    Location:  Shoulder/arm   Shoulder/arm location:  R upper arm   Length (cm):  3 Pre-procedure details:    Preparation:  Patient was prepped and draped in usual sterile fashion Exploration:    Hemostasis achieved with:  Direct pressure   Imaging outcome: foreign body not noted     Contaminated: no   Treatment:    Area cleansed with:  Soap and water    Amount of cleaning:  Standard   Irrigation solution:  Sterile saline   Irrigation method:  Pressure wash   Debridement:  None   Undermining:  None   Scar revision: no   Skin repair:    Repair method:  Sutures   Suture size:  4-0   Suture material:  Prolene   Suture technique:  Simple interrupted   Number of sutures:  3 Approximation:    Approximation:  Close Repair type:    Repair type:  Simple Post-procedure details:    Dressing:  Antibiotic ointment and non-adherent dressing   Procedure completion:  [place] Harold Tillman DASEN, PA-C 05/19/24 1451    Charlyn Sora, MD 05/20/24 0730
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
## 2024-05-19 NOTE — ED Notes (Signed)
[employer] at bedside 
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
